# Patient Record
Sex: Female | Born: 1980 | Race: White | Hispanic: No | Marital: Single | Smoking: Never smoker
Health system: Southern US, Community
[De-identification: ages and names within clinical notes are randomized; demographics above are authoritative.]

## PROBLEM LIST (undated history)

## (undated) DIAGNOSIS — T7840XA Allergy, unspecified, initial encounter: Secondary | ICD-10-CM

## (undated) HISTORY — DX: Allergy, unspecified, initial encounter: T78.40XA

---

## 2008-11-16 HISTORY — PX: EYE SURGERY: SHX253

## 2013-07-01 ENCOUNTER — Emergency Department: Payer: Self-pay | Admitting: Emergency Medicine

## 2014-07-29 ENCOUNTER — Ambulatory Visit
Admission: EM | Admit: 2014-07-29 | Discharge: 2014-07-29 | Disposition: A | Discharge status: Home / Self Care | Payer: BLUE CROSS/BLUE SHIELD | Attending: Emergency Medicine | Admitting: Emergency Medicine

## 2014-07-29 ENCOUNTER — Ambulatory Visit: Payer: BLUE CROSS/BLUE SHIELD

## 2014-07-29 ENCOUNTER — Ambulatory Visit (HOSPITAL_COMMUNITY): Payer: BLUE CROSS/BLUE SHIELD

## 2014-07-29 ENCOUNTER — Other Ambulatory Visit: Payer: Self-pay

## 2014-07-29 DIAGNOSIS — J189 Pneumonia, unspecified organism: Secondary | ICD-10-CM | POA: Diagnosis not present

## 2014-07-29 DIAGNOSIS — R079 Chest pain, unspecified: Secondary | ICD-10-CM

## 2014-07-29 DIAGNOSIS — R05 Cough: Secondary | ICD-10-CM | POA: Diagnosis present

## 2014-07-29 LAB — CBC WITH DIFFERENTIAL/PLATELET
BASOS PCT: 1 %
Basophils Absolute: 0.1 10*3/uL (ref 0–0.1)
EOS PCT: 0 %
Eosinophils Absolute: 0 10*3/uL (ref 0–0.7)
HEMATOCRIT: 38 % (ref 35.0–47.0)
HEMOGLOBIN: 12.7 g/dL (ref 12.0–16.0)
Lymphocytes Relative: 6 %
Lymphs Abs: 0.8 10*3/uL — ABNORMAL LOW (ref 1.0–3.6)
MCH: 29.5 pg (ref 26.0–34.0)
MCHC: 33.4 g/dL (ref 32.0–36.0)
MCV: 88.4 fL (ref 80.0–100.0)
Monocytes Absolute: 1.1 10*3/uL — ABNORMAL HIGH (ref 0.2–0.9)
Monocytes Relative: 8 %
Neutro Abs: 12.2 10*3/uL — ABNORMAL HIGH (ref 1.4–6.5)
Neutrophils Relative %: 85 %
Platelets: 170 10*3/uL (ref 150–440)
RBC: 4.3 MIL/uL (ref 3.80–5.20)
RDW: 12.5 % (ref 11.5–14.5)
WBC: 14.2 10*3/uL — ABNORMAL HIGH (ref 3.6–11.0)

## 2014-07-29 LAB — BASIC METABOLIC PANEL
Anion gap: 7 (ref 5–15)
BUN: 8 mg/dL (ref 6–20)
CALCIUM: 9 mg/dL (ref 8.9–10.3)
CO2: 25 mmol/L (ref 22–32)
Chloride: 104 mmol/L (ref 101–111)
Creatinine, Ser: 0.85 mg/dL (ref 0.44–1.00)
GFR calc Af Amer: >60 mL/min (ref >60)
GFR calc non Af Amer: >60 mL/min (ref >60)
GLUCOSE: 119 mg/dL — AB (ref 65–99)
Potassium: 4.2 mmol/L (ref 3.5–5.1)
SODIUM: 136 mmol/L (ref 135–145)

## 2014-07-29 LAB — FIBRIN DERIVATIVES D-DIMER (ARMC ONLY): Fibrin derivatives D-dimer (ARMC): 550 — ABNORMAL HIGH (ref 0–499)

## 2014-07-29 MED ORDER — ALBUTEROL SULFATE HFA 108 (90 BASE) MCG/ACT IN AERS: 2.0000 | INHALATION_SPRAY | RESPIRATORY_TRACT | Status: DC | PRN

## 2014-07-29 MED ORDER — AZITHROMYCIN 250 MG PO TABS: 250.0000 mg | ORAL_TABLET | Freq: Every day | ORAL | Status: DC

## 2014-07-29 MED ORDER — HYDROCOD POLST-CPM POLST ER 10-8 MG/5ML PO SUER: 5.0000 mL | Freq: Two times a day (BID) | ORAL | Status: DC | PRN

## 2014-07-29 MED ORDER — ACETAMINOPHEN 500 MG PO TABS
500.0000 mg | ORAL_TABLET | Freq: Once | ORAL | Status: AC
  Administered 2014-07-29: 500 mg via ORAL

## 2014-07-29 MED ORDER — NAPROXEN 500 MG PO TABS: 500.0000 mg | ORAL_TABLET | Freq: Two times a day (BID) | ORAL | Status: DC

## 2014-07-29 MED ORDER — KETOROLAC TROMETHAMINE 60 MG/2ML IM SOLN
60.0000 mg | Freq: Once | INTRAMUSCULAR | Status: AC
  Administered 2014-07-29: 60 mg via INTRAMUSCULAR

## 2014-07-29 NOTE — ED Notes (Signed)
Patient wuith left sided rib/chest pain, left arm pain started yesterday. Hasnt been feeling well the past few days.

## 2014-07-29 NOTE — Discharge Instructions (Signed)
2 puffs from your albuterol inhaler every 4-6 hours as needed for coughing and wheezing. naprosyn will help with fever and also with the chest wall pain. Go to the ER for the signs and symptoms we discussed

## 2014-07-29 NOTE — ED Provider Notes (Signed)
HPI  SUBJECTIVE:  Mercedes Ballard is a 34 y.o. female who presents with constant left-sided lower rib/chest pain that radiates up to her neck into her shoulder starting at 0700 today. She describes the pain as sharp, worse with inspiration.  Symptoms are worse with deep inspiration, moving, torso rotation, lying down, better with shallow breathing. She has not been feeling well for several days of URI-like symptoms. Reports cough, malaise, nasal congestion, sore scratchy throat, postnasal drip, feeling feverish, but did not take her temperature last night. She states that she's been doing "lots of coughing" and reports wheezing. She denies any trauma to the chest. There is no exertional component to her pain. Patient denies nausea, vomiting, diaphoresis, abdominal pain, presyncope, syncope. No exogenous estrogen, surgery in the past 4 weeks, prolonged immobilization, leg pain, calf swelling, hemoptysis, history of DVT, PE, cancer. Past medical history negative for hypercholesterolemia, hypertension, diabetes, asthma, emphysema, COPD, tobacco use. Family history negative for MI, coronary artery disease.   History reviewed. No pertinent past medical history.  History reviewed. No pertinent past surgical history.  History reviewed. No pertinent family history.  History  Substance Use Topics  . Smoking status: Never Smoker   . Smokeless tobacco: Never Used  . Alcohol Use: Yes     Comment: occasional    No current facility-administered medications for this encounter.  Current outpatient prescriptions:  .  albuterol (PROVENTIL HFA;VENTOLIN HFA) 108 (90 BASE) MCG/ACT inhaler, Inhale 2 puffs into the lungs every 4 (four) hours as needed for wheezing or shortness of breath. Dispense with aerochamber, Disp: 1 Inhaler, Rfl: 0 .  azithromycin (ZITHROMAX) 250 MG tablet, Take 1 tablet (250 mg total) by mouth daily. 2 tabs po on day 1, 1 tab po on days 2-5, Disp: 6 tablet, Rfl: 0 .   chlorpheniramine-HYDROcodone (TUSSIONEX PENNKINETIC ER) 10-8 MG/5ML SUER, Take 5 mLs by mouth every 12 (twelve) hours as needed for cough., Disp: 120 mL, Rfl: 0 .  naproxen (NAPROSYN) 500 MG tablet, Take 1 tablet (500 mg total) by mouth 2 (two) times daily., Disp: 20 tablet, Rfl: 0  No Known Allergies   ROS  As noted in HPI.   Physical Exam  BP 95/65 mmHg  Pulse 86  Temp(Src) 101 F (38.3 C) (Tympanic)  Resp 20  Ht  (1.727 m)  Wt 145 lb (65.772 kg)  BMI 22.05 kg/m2  SpO2 99%  LMP 07/23/2014  Constitutional: Well developed, well nourished, appears uncomfortable Eyes: PERRL, EOMI, conjunctiva normal bilaterally HENT: Normocephalic, atraumatic,mucus membranes moist Respiratory: Poor inspiratory effort Clear to auscultation bilaterally, no rales, no wheezing, no rhonchi. No chest wall tenderness Cardiovascular: Normal rate and rhythm, no murmurs, no gallops, no rubs. RP, DP 2+ equal bilaterally GI: Soft, nondistended, normal bowel sounds, nontender, no rebound, no guarding Back: no CVAT skin: No rash, skin intact Musculoskeletal: No edema, no tenderness, no deformities, calves Symmetric, Nontender Neurologic: Alert & oriented x 3, CN II-XII grossly intact, no motor deficits, sensation grossly intact Psychiatric: Speech and behavior appropriate   ED Course   Medications  ketorolac (TORADOL) injection 60 mg (60 mg Intramuscular Given 07/29/14 1409)  acetaminophen (TYLENOL) tablet 500 mg (500 mg Oral Given 07/29/14 1415)    Orders Placed This Encounter  Procedures  . DG Chest 2 View    Standing Status: Standing     Number of Occurrences: 1     Standing Expiration Date:     Order Specific Question:  Symptom/Reason for Exam    Answer:  Left sided chest pain [[id number]]  . CBC with Differential    Standing Status: Standing     Number of Occurrences: 1     Standing Expiration Date:   . Fibrin derivatives D-Dimer    Standing Status: Standing     Number of Occurrences: 1      Standing Expiration Date:   . Basic metabolic panel    Standing Status: Standing     Number of Occurrences: 1     Standing Expiration Date:   . ED EKG    Standing Status: Standing     Number of Occurrences: 1     Standing Expiration Date:     Order Specific Question:  Reason for Exam    Answer:  Chest Pain   Results for orders placed or performed during the hospital encounter of 07/29/14 (from the past 24 hour(s))  CBC with Differential     Status: Abnormal   Collection Time: 07/29/14  2:09 PM  Result Value Ref Range   WBC 14.2 (H) 3.6 - 11.0 K/uL   RBC 4.30 3.80 - 5.20 MIL/uL   Hemoglobin 12.7 12.0 - 16.0 g/dL   HCT 45.4 09.8 - 11.9 %   MCV 88.4 80.0 - 100.0 fL   MCH 29.5 26.0 - 34.0 pg   MCHC 33.4 32.0 - 36.0 g/dL   RDW 14.7 82.9 - 56.2 %   Platelets 170 150 - 440 K/uL   Neutrophils Relative % 85 %   Neutro Abs 12.2 (H) 1.4 - 6.5 K/uL   Lymphocytes Relative 6 %   Lymphs Abs 0.8 (L) 1.0 - 3.6 K/uL   Monocytes Relative 8 %   Monocytes Absolute 1.1 (H) 0.2 - 0.9 K/uL   Eosinophils Relative 0 %   Eosinophils Absolute 0.0 0 - 0.7 K/uL   Basophils Relative 1 %   Basophils Absolute 0.1 0 - 0.1 K/uL  Fibrin derivatives D-Dimer     Status: Abnormal   Collection Time: 07/29/14  2:09 PM  Result Value Ref Range   Fibrin derivatives D-dimer ([facility]) 550 (H) 0 - 499  Basic metabolic panel     Status: Abnormal   Collection Time: 07/29/14  2:09 PM  Result Value Ref Range   Sodium 136 135 - 145 mmol/L   Potassium 4.2 3.5 - 5.1 mmol/L   Chloride 104 101 - 111 mmol/L   CO2 25 22 - 32 mmol/L   Glucose, Bld 119 (H) 65 - 99 mg/dL   BUN 8 6 - 20 mg/dL   Creatinine, Ser 1.30 0.44 - 1.00 mg/dL   Calcium 9.0 8.9 - 86.5 mg/dL   GFR calc non Af Amer >60 >60 mL/min   GFR calc Af Amer >60 >60 mL/min   Anion gap 7 5 - 15   Dg Chest 2 View  07/29/2014   CLINICAL DATA:  Productive cough for 4 days. Worsening symptoms today. Initial encounter.  EXAM: CHEST  2 VIEW  COMPARISON:  None.   FINDINGS: The heart size and mediastinal contours are normal. There is patchy retrocardiac left lower lobe airspace disease which is best seen on the frontal examination. The right lung is clear. There is no pleural effusion. The bones appear unchanged.  IMPRESSION: Left lower lobe pneumonia. Followup PA and lateral chest X-ray is recommended in 3-4 weeks following trial of antibiotic therapy to ensure resolution and exclude underlying malignancy.   Electronically Signed   By: Carey Bullocks M.D.   On: 07/29/2014 15:16    ED Clinical  Impression  Community acquired pneumonia  Left sided chest pain - Plan: DG Chest 2 View, DG Chest 2 View   ED Assessment/Plan  EKG: Normal sinus rhythm, rate 87 normal axis, normal intervals. No hypertrophy. No ST-T wave changes. No previous EKG for comparison.   Patient had a fever of 101 while here, gave her some Tylenol. Also gave her some Toradol for her presumed pleurisy. Obtaining x-ray CBC, BMP, d-dimer.  Labs, imaging reviewed. Left lower lobe pneumonia. See radiology report for details. Mild leukocytosis. Elevated dimer noted, however, patient meets PERC rule, and Wells criteria is 0 out of 9. She is not tachycardic, she is satting well on room air, and we have a reasonable expiration of her chest pain. She is low risk for PE. We'll send him home with cough syrup, azithromycin, albuterol.   Reviewed labs, imaging independently. As above.   Discussed labs, imaging, MDM, plan and followup with patient. Discussed sn/sx that should prompt return to the UC or ED. Patient agrees with plan.   *This clinic note was created using Dragon dictation software. Therefore, there may be occasional mistakes despite careful proofreading.  ?   Domenick Gong, MD 07/29/14 1534

## 2014-08-01 ENCOUNTER — Encounter: Payer: Self-pay | Admitting: Family Medicine

## 2014-08-01 ENCOUNTER — Ambulatory Visit (INDEPENDENT_AMBULATORY_CARE_PROVIDER_SITE_OTHER): Payer: BLUE CROSS/BLUE SHIELD | Admitting: Family Medicine

## 2014-08-01 VITALS — BP 98/60 | HR 70 | Ht 68.0 in | Wt 147.4 lb

## 2014-08-01 DIAGNOSIS — J189 Pneumonia, unspecified organism: Secondary | ICD-10-CM | POA: Diagnosis not present

## 2014-08-01 DIAGNOSIS — J181 Lobar pneumonia, unspecified organism: Principal | ICD-10-CM

## 2014-08-01 DIAGNOSIS — J302 Other seasonal allergic rhinitis: Secondary | ICD-10-CM

## 2014-08-01 NOTE — Progress Notes (Signed)
Date:  08/01/2014   Name:  Mercedes Ballard   DOB:  [birth year]   MRN:  [id number]  PCP:  Schuyler Amor, MD    Chief Complaint: Establish Care   History of Present Illness:  This is a 34 y.o. female seen [facility] with LLL pneumonia on CXR with associated fever and leukocytosis, also L sided chest pain. Given Toradol and placed on Zithromax, Naprosyn, Proventil inhaler prn, and Tussionex prn. Feeling significantly better today, fever and chest resolved, no longer needing Proventil. Still fatigued but this improving also. 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 Still with productive cough, occ blood-tinged. Hx walking pneumonia 7 yrs ago, no other recent infections.  Review of Systems:  Review of Systems  There are no active problems to display for this patient.   Prior to Admission medications   Medication Sig Start Date End Date Taking? Authorizing Provider  albuterol (PROVENTIL HFA;VENTOLIN HFA) 108 (90 BASE) MCG/ACT inhaler Inhale 2 puffs into the lungs every 4 (four) hours as needed for wheezing or shortness of breath. Dispense with aerochamber 07/29/14  Yes Domenick Gong, MD  azithromycin (ZITHROMAX) 250 MG tablet Take 1 tablet (250 mg total) by mouth daily. 2 tabs po on day 1, 1 tab po on days 2-5 07/29/14  Yes Domenick Gong, MD  loratadine (SM LORATADINE) 5 MG/5ML syrup Take by mouth. 05/28/10  Yes Historical Provider, MD    Allergies  Allergen Reactions  . Latex Rash    Past Surgical History  Procedure Laterality Date  . Eye surgery Bilateral 11/2008    History  Substance Use Topics  . Smoking status: Never Smoker   . Smokeless tobacco: Never Used  . Alcohol Use: 0.0 oz/week    0 Standard drinks or equivalent per week     Comment: occasional    Family History  Problem Relation Age of Onset  . Varicose Veins Mother   . Alcohol abuse Father   . Asthma Father   . Depression Father   . Drug abuse Father   .  Hypertension Father   . Heart disease Maternal Uncle   . Stroke Maternal Uncle     Medication list has been reviewed and updated.  Physical Examination: BP 98/60 mmHg  Pulse 70  Ht 5\' 8"  (1.727 m)  Wt 147 lb 6.4 oz (66.86 kg)  BMI 22.42 kg/m2  LMP 07/23/2014  Physical Exam  Constitutional: She appears well-developed and well-nourished. No distress.  Cardiovascular: Normal rate, regular rhythm and normal heart sounds.  Exam reveals no gallop.   No murmur heard. Pulmonary/Chest: Effort normal. No respiratory distress. She has no wheezes.  Rales L lung base  Skin: She is not diaphoretic.  Psychiatric: She has a normal mood and affect. Her behavior is normal.    Assessment and Plan:  1. LLL pneumonia Resolving with Zpak, complete course, d/c Tussionex and Naprosyn, may use Mucinex, call if sxs worsen or persist > 1 wk, cough may be last sxs to resolve   Return if symptoms worsen or fail to improve.  Dionne Ano. Kingsley Spittle MD [facility]  08/01/2014

## 2015-05-01 ENCOUNTER — Ambulatory Visit (INDEPENDENT_AMBULATORY_CARE_PROVIDER_SITE_OTHER): Payer: BLUE CROSS/BLUE SHIELD | Admitting: Family Medicine

## 2015-05-01 ENCOUNTER — Encounter: Payer: Self-pay | Admitting: Family Medicine

## 2015-05-01 VITALS — BP 106/58 | HR 68 | Ht 68.0 in | Wt 151.0 lb

## 2015-05-01 DIAGNOSIS — J40 Bronchitis, not specified as acute or chronic: Secondary | ICD-10-CM

## 2015-05-01 DIAGNOSIS — J0100 Acute maxillary sinusitis, unspecified: Secondary | ICD-10-CM

## 2015-05-01 MED ORDER — MONTELUKAST SODIUM 10 MG PO TABS: 10.0000 mg | ORAL_TABLET | Freq: Every day | ORAL | Status: DC

## 2015-05-01 MED ORDER — AMOXICILLIN-POT CLAVULANATE 875-125 MG PO TABS: 1.0000 | ORAL_TABLET | Freq: Two times a day (BID) | ORAL | Status: DC

## 2015-05-01 MED ORDER — GUAIFENESIN-CODEINE 100-10 MG/5ML PO SYRP: 5.0000 mL | ORAL_SOLUTION | Freq: Three times a day (TID) | ORAL | Status: DC | PRN

## 2015-05-01 NOTE — Progress Notes (Signed)
Name: Mercedes Ballard   MRN: [id number]    DOB: Sep 08, 1980   Date:05/02/2015       Progress Note  Subjective  Chief Complaint  Chief Complaint  Patient presents with  . Sinusitis    Congestion, cough, green mucus drainage for 1 week. Patient has been using Claritin and Mucinex. Patient has history of pneumonia    Sinusitis This is a new problem. The current episode started 1 to 4 weeks ago. The problem has been waxing and waning since onset. There has been no fever. Associated symptoms include congestion, coughing, ear pain, headaches, sinus pressure and a sore throat. Pertinent negatives include no chills, neck pain or shortness of breath. Past treatments include acetaminophen and oral decongestants. The treatment provided mild relief.    No problem-specific assessment & plan notes found for this encounter.   Past Medical History  Diagnosis Date  . Allergy     Past Surgical History  Procedure Laterality Date  . Eye surgery Bilateral 11/2008    Family History  Problem Relation Age of Onset  . Varicose Veins Mother   . Alcohol abuse Father   . Asthma Father   . Depression Father   . Drug abuse Father   . Hypertension Father   . Heart disease Maternal Uncle   . Stroke Maternal Uncle     Social History   Social History  . Marital Status: Unknown    Spouse Name: N/A  . Number of Children: N/A  . Years of Education: N/A   Occupational History  . Not on file.   Social History Main Topics  . Smoking status: Never Smoker   . Smokeless tobacco: Never Used  . Alcohol Use: 0.0 oz/week    0 Standard drinks or equivalent per week     Comment: occasional  . Drug Use: No  . Sexual Activity:    Partners: Male    Pharmacist, hospital Protection: IUD   Other Topics Concern  . Not on file   Social History Narrative    Allergies  Allergen Reactions  . Latex Rash     Review of Systems  Constitutional: Negative for fever, chills, weight loss and malaise/fatigue.  HENT:  Positive for congestion, ear pain, sinus pressure and sore throat. Negative for ear discharge.   Eyes: Negative for blurred vision.  Respiratory: Positive for cough. Negative for sputum production, shortness of breath and wheezing.   Cardiovascular: Negative for chest pain, palpitations and leg swelling.  Gastrointestinal: Negative for heartburn, nausea, abdominal pain, diarrhea, constipation, blood in stool and melena.  Genitourinary: Negative for dysuria, urgency, frequency and hematuria.  Musculoskeletal: Negative for myalgias, back pain, joint pain and neck pain.  Skin: Negative for rash.  Neurological: Positive for headaches. Negative for dizziness, tingling, sensory change and focal weakness.  Endo/Heme/Allergies: Negative for environmental allergies and polydipsia. Does not bruise/bleed easily.  Psychiatric/Behavioral: Negative for depression and suicidal ideas. The patient is not nervous/anxious and does not have insomnia.      Objective  Filed Vitals:   05/01/15 1358  BP: 106/58  Pulse: 68  Height:  (1.727 m)  Weight: 151 lb (68.493 kg)    Physical Exam  Constitutional: She is well-developed, well-nourished, and in no distress. No distress.  HENT:  Head: Normocephalic and atraumatic.  Right Ear: External ear normal.  Left Ear: External ear normal.  Nose: Nose normal.  Mouth/Throat: Oropharynx is clear and moist.  Eyes: Conjunctivae and EOM are normal. Pupils are equal, round, and reactive  to light. Right eye exhibits no discharge. Left eye exhibits no discharge.  Neck: Normal range of motion. Neck supple. No JVD present. No thyromegaly present.  Cardiovascular: Normal rate, regular rhythm, normal heart sounds and intact distal pulses.  Exam reveals no gallop and no friction rub.   No murmur heard. Pulmonary/Chest: Effort normal and breath sounds normal.  Abdominal: Soft. Bowel sounds are normal. She exhibits no mass. There is no tenderness. There is no guarding.   Musculoskeletal: Normal range of motion. She exhibits no edema.  Lymphadenopathy:    She has no cervical adenopathy.  Neurological: She is alert. She has normal reflexes.  Skin: Skin is warm and dry. She is not diaphoretic.  Psychiatric: Mood and affect normal.  Nursing note and vitals reviewed.     Assessment & Plan  Problem List Items Addressed This Visit    None    Visit Diagnoses    Acute maxillary sinusitis, recurrence not specified    -  Primary    Relevant Medications    loratadine (CLARITIN) 10 MG tablet    amoxicillin-clavulanate (AUGMENTIN) 875-125 MG tablet    guaiFENesin-codeine (ROBITUSSIN AC) 100-10 MG/5ML syrup    montelukast (SINGULAIR) 10 MG tablet    Bronchitis        Relevant Medications    amoxicillin-clavulanate (AUGMENTIN) 875-125 MG tablet    guaiFENesin-codeine (ROBITUSSIN AC) 100-10 MG/5ML syrup         Dr. Hayden Rasmusseneanna Keeven Matty [facility]  05/02/2015

## 2015-05-08 ENCOUNTER — Ambulatory Visit (INDEPENDENT_AMBULATORY_CARE_PROVIDER_SITE_OTHER): Payer: BLUE CROSS/BLUE SHIELD | Admitting: Family Medicine

## 2015-05-08 ENCOUNTER — Encounter: Payer: Self-pay | Admitting: Family Medicine

## 2015-05-08 VITALS — BP 100/62 | HR 82 | Temp 99.9°F | Resp 16

## 2015-05-08 DIAGNOSIS — R509 Fever, unspecified: Secondary | ICD-10-CM

## 2015-05-08 DIAGNOSIS — A084 Viral intestinal infection, unspecified: Secondary | ICD-10-CM

## 2015-05-08 NOTE — Patient Instructions (Signed)

## 2015-05-10 NOTE — Progress Notes (Signed)
Date:  05/08/2015   Name:  Mercedes Ballard   DOB:  10/29/1980   MRN:  [id number]  PCP:  Schuyler AmorWilliam Caylor Cerino, MD    Chief Complaint: Fever and Diarrhea   History of Present Illness:  This is a 35 y.o. female with myalgias, nausea, headache, diarrhea, and fever since yesterday. On Augmentin since 3/15 for sinusitis which has improved. No known exposure.  Review of Systems:  Review of Systems  HENT: Negative for rhinorrhea and sore throat.   Respiratory: Negative for cough.   Gastrointestinal: Negative for blood in stool.  Genitourinary: Negative for difficulty urinating.  Neurological: Negative for syncope and light-headedness.    Patient Active Problem List   Diagnosis Date Noted  . Allergic rhinitis, seasonal 08/01/2014    Prior to Admission medications   Medication Sig Start Date End Date Taking? Authorizing Provider  amoxicillin-clavulanate (AUGMENTIN) 875-125 MG tablet Take 1 tablet by mouth 2 (two) times daily. 05/01/15  Yes Duanne Limerickeanna C Jones, MD  guaiFENesin-codeine (ROBITUSSIN AC) 100-10 MG/5ML syrup Take 5 mLs by mouth 3 (three) times daily as needed for cough. 05/01/15  Yes Duanne Limerickeanna C Jones, MD  Levonorgestrel (MIRENA, 52 MG, IU) by Intrauterine route.   Yes Historical Provider, MD  loratadine (CLARITIN) 10 MG tablet Take 10 mg by mouth daily.   Yes Historical Provider, MD  montelukast (SINGULAIR) 10 MG tablet Take 1 tablet (10 mg total) by mouth at bedtime. 05/01/15  Yes Duanne Limerickeanna C Jones, MD  naproxen (NAPROSYN) 250 MG tablet Take 250 mg by mouth 2 (two) times daily with a meal.   Yes Historical Provider, MD    Allergies  Allergen Reactions  . Latex Rash    Past Surgical History  Procedure Laterality Date  . Eye surgery Bilateral 11/2008    Social History  Substance Use Topics  . Smoking status: Never Smoker   . Smokeless tobacco: Never Used  . Alcohol Use: 0.0 oz/week    0 Standard drinks or equivalent per week     Comment: occasional    Family History  Problem Relation  Age of Onset  . Varicose Veins Mother   . Alcohol abuse Father   . Asthma Father   . Depression Father   . Drug abuse Father   . Hypertension Father   . Heart disease Maternal Uncle   . Stroke Maternal Uncle     Medication list has been reviewed and updated.  Physical Examination: BP 100/62 mmHg  Pulse 82  Temp(Src) 99.9 F (37.7 C)  Resp 16  SpO2 97%  Physical Exam  Constitutional: She appears well-developed and well-nourished.  Pulmonary/Chest: Effort normal and breath sounds normal.  Abdominal: Soft. She exhibits no distension and no mass. There is no rebound and no guarding.  Mild diffuse tenderness  Musculoskeletal: She exhibits no edema.  Neurological: She is alert.  Skin: Skin is warm and dry.  Psychiatric: She has a normal mood and affect. Her behavior is normal.  Nursing note and vitals reviewed.   Assessment and Plan:  1. Viral gastroenteritis Push PO fluids, Tylenol or Advil for myalgias  2. Fever, unspecified fever cause Rapid flu negative - POCT Influenza A/B  Return if symptoms worsen or fail to improve.  Dionne AnoWilliam M. Kingsley SpittlePlonk, Jr. MD [facility]  05/10/2015

## 2015-06-27 ENCOUNTER — Other Ambulatory Visit: Payer: Self-pay

## 2016-04-30 ENCOUNTER — Ambulatory Visit (INDEPENDENT_AMBULATORY_CARE_PROVIDER_SITE_OTHER): Payer: BLUE CROSS/BLUE SHIELD | Admitting: Family Medicine

## 2016-04-30 ENCOUNTER — Encounter: Payer: Self-pay | Admitting: Family Medicine

## 2016-04-30 VITALS — BP 128/80 | HR 68 | Ht 68.0 in | Wt 148.0 lb

## 2016-04-30 DIAGNOSIS — N644 Mastodynia: Secondary | ICD-10-CM | POA: Diagnosis not present

## 2016-04-30 DIAGNOSIS — N3090 Cystitis, unspecified without hematuria: Secondary | ICD-10-CM

## 2016-04-30 DIAGNOSIS — Z0000 Encounter for general adult medical examination without abnormal findings: Secondary | ICD-10-CM | POA: Diagnosis not present

## 2016-04-30 LAB — POCT URINALYSIS DIPSTICK
Bilirubin, UA: NEGATIVE
Blood, UA: NEGATIVE
GLUCOSE UA: NEGATIVE
Ketones, UA: NEGATIVE
Nitrite, UA: NEGATIVE
Protein, UA: NEGATIVE
Spec Grav, UA: 1.02
UROBILINOGEN UA: 0.2
pH, UA: 6

## 2016-04-30 MED ORDER — SULFAMETHOXAZOLE-TRIMETHOPRIM 800-160 MG PO TABS: 1.0000 | ORAL_TABLET | Freq: Two times a day (BID) | ORAL | 0 refills | Status: DC

## 2016-04-30 NOTE — Patient Instructions (Signed)

## 2016-04-30 NOTE — Progress Notes (Signed)
Name: Mercedes Ballard   MRN: [id number]    DOB: [birth year]   Date:04/30/2016       Progress Note  Subjective  Chief Complaint  Chief Complaint  Patient presents with  . Annual Exam    no pap  . Breast Problem    bilateral breast tenderness    Patient presents for annual physical exam. Only specific concern is breast tenderness bilateral..    No problem-specific Assessment & Plan notes found for this encounter.   Past Medical History:  Diagnosis Date  . Allergy     Past Surgical History:  Procedure Laterality Date  . EYE SURGERY Bilateral 11/2008    Family History  Problem Relation Age of Onset  . Varicose Veins Mother   . Alcohol abuse Father   . Asthma Father   . Depression Father   . Drug abuse Father   . Hypertension Father   . Heart disease Maternal Uncle   . Stroke Maternal Uncle     Social History   Social History  . Marital status: Unknown    Spouse name: N/A  . Number of children: N/A  . Years of education: N/A   Occupational History  . Not on file.   Social History Main Topics  . Smoking status: Never Smoker  . Smokeless tobacco: Never Used  . Alcohol use 0.0 oz/week     Comment: occasional  . Drug use: No  . Sexual activity: Yes    Partners: Male    Birth control/ protection: IUD   Other Topics Concern  . Not on file   Social History Narrative  . No narrative on file    Allergies  Allergen Reactions  . Latex Rash    Outpatient Medications Prior to Visit  Medication Sig Dispense Refill  . Levonorgestrel (MIRENA, 52 MG, IU) by Intrauterine route.    . loratadine (CLARITIN) 10 MG tablet Take 10 mg by mouth daily.    Marland Kitchen amoxicillin-clavulanate (AUGMENTIN) 875-125 MG tablet Take 1 tablet by mouth 2 (two) times daily. 20 tablet 0  . guaiFENesin-codeine (ROBITUSSIN AC) 100-10 MG/5ML syrup Take 5 mLs by mouth 3 (three) times daily as needed for cough. 150 mL 0  . montelukast (SINGULAIR) 10 MG tablet Take 1 tablet (10 mg total) by mouth at  bedtime. 30 tablet 3  . naproxen (NAPROSYN) 250 MG tablet Take 250 mg by mouth 2 (two) times daily with a meal.     No facility-administered medications prior to visit.     Review of Systems  Constitutional: Negative for chills, diaphoresis, fever, malaise/fatigue and weight loss.  HENT: Negative for congestion, ear discharge, ear pain, hearing loss, nosebleeds, sinus pain, sore throat and tinnitus.   Eyes: Negative for blurred vision, double vision, photophobia, pain, discharge and redness.  Respiratory: Negative for cough, hemoptysis, sputum production, shortness of breath, wheezing and stridor.   Cardiovascular: Negative for chest pain, palpitations, orthopnea, claudication, leg swelling and PND.       Evening lower extremeties sweeling  Gastrointestinal: Positive for heartburn. Negative for abdominal pain, blood in stool, constipation, diarrhea, melena, nausea and vomiting.       Certain foods  Genitourinary: Negative for dysuria, flank pain, frequency, hematuria and urgency.  Musculoskeletal: Negative for back pain, falls, joint pain, myalgias and neck pain.  Skin: Negative for itching and rash.  Neurological: Negative for dizziness, tingling, tremors, sensory change, speech change, focal weakness, seizures, loss of consciousness, weakness and headaches.  Endo/Heme/Allergies: Negative for environmental allergies and polydipsia.  Does not bruise/bleed easily.       No galactorrhea  Psychiatric/Behavioral: Negative for depression, hallucinations, memory loss, substance abuse and suicidal ideas. The patient is not nervous/anxious and does not have insomnia.      Objective  Vitals:   04/30/16 0946  BP: 128/80  Pulse: 68  Weight: 148 lb (67.1 kg)  Height: 5\' 8"  (1.727 m)    Physical Exam  Constitutional: She is oriented to person, place, and time and well-developed, well-nourished, and in no distress. No distress.  HENT:  Head: Normocephalic and atraumatic.  Right Ear: Tympanic  membrane, external ear and ear canal normal.  Left Ear: Tympanic membrane, external ear and ear canal normal.  Nose: Nose normal.  Mouth/Throat: Uvula is midline, oropharynx is clear and moist and mucous membranes are normal. No oropharyngeal exudate, posterior oropharyngeal edema, posterior oropharyngeal erythema or tonsillar abscesses.  Eyes: Conjunctivae and EOM are normal. Pupils are equal, round, and reactive to light. Right eye exhibits no discharge. Left eye exhibits no discharge. No scleral icterus.  Fundoscopic exam:      The right eye shows no arteriolar narrowing, no AV nicking and no papilledema.       The left eye shows no arteriolar narrowing, no AV nicking and no papilledema.  Neck: Trachea normal and normal range of motion. Neck supple. Normal carotid pulses, no hepatojugular reflux and no JVD present. Carotid bruit is not present. No thyromegaly present.  Cardiovascular: Normal rate, regular rhythm, S1 normal, S2 normal, normal heart sounds, intact distal pulses and normal pulses.  PMI is not displaced.  Exam reveals no gallop, no S3, no S4 and no friction rub.   No murmur heard. Pulmonary/Chest: Effort normal and breath sounds normal. No accessory muscle usage. No respiratory distress. Right breast exhibits no inverted nipple, no mass, no nipple discharge, no skin change and no tenderness. Left breast exhibits no inverted nipple, no mass, no nipple discharge, no skin change and no tenderness. Breasts are symmetrical.  Abdominal: Soft. Normal appearance, normal aorta and bowel sounds are normal. She exhibits no mass. There is no hepatosplenomegaly. There is no tenderness. There is no guarding and no CVA tenderness.  Musculoskeletal: Normal range of motion. She exhibits no edema.  Lymphadenopathy:       Head (right side): No submental, no submandibular, no tonsillar, no preauricular, no posterior auricular and no occipital adenopathy present.       Head (left side): No submental, no  submandibular, no tonsillar, no preauricular, no posterior auricular and no occipital adenopathy present.    She has no cervical adenopathy.    She has no axillary adenopathy.  Neurological: She is alert and oriented to person, place, and time. She has normal sensation, normal strength, normal reflexes and intact cranial nerves.  Skin: Skin is warm and dry. She is not diaphoretic.  Nevus left 5th toe/sole  Psychiatric: Mood and affect normal.      Assessment & Plan  Problem List Items Addressed This Visit    None    Visit Diagnoses    Annual physical exam    -  Primary   Relevant Orders   Lipid Profile   Renal function panel   POCT urinalysis dipstick (Completed)   Cystitis       Relevant Medications   sulfamethoxazole-trimethoprim (BACTRIM DS,SEPTRA DS) 800-160 MG tablet      Meds ordered this encounter  Medications  . sulfamethoxazole-trimethoprim (BACTRIM DS,SEPTRA DS) 800-160 MG tablet    Sig: Take 1 tablet by  mouth 2 (two) times daily.    Dispense:  6 tablet    Refill:  0      Dr. Hayden Rasmusseneanna Jerone Cudmore [facility]  04/30/16

## 2016-05-01 LAB — RENAL FUNCTION PANEL
ALBUMIN: 4.7 g/dL (ref 3.5–5.5)
BUN/Creatinine Ratio: 14 (ref 9–23)
BUN: 13 mg/dL (ref 6–20)
CALCIUM: 9.2 mg/dL (ref 8.7–10.2)
CO2: 25 mmol/L (ref 18–29)
CREATININE: 0.93 mg/dL (ref 0.57–1.00)
Chloride: 103 mmol/L (ref 96–106)
GFR calc Af Amer: 91 mL/min/{1.73_m2} (ref >59)
GFR, EST NON AFRICAN AMERICAN: 79 mL/min/{1.73_m2} (ref >59)
Glucose: 86 mg/dL (ref 65–99)
PHOSPHORUS: 3.2 mg/dL (ref 2.5–4.5)
Potassium: 4.1 mmol/L (ref 3.5–5.2)
Sodium: 144 mmol/L (ref 134–144)

## 2016-05-01 LAB — LIPID PANEL
CHOLESTEROL TOTAL: 180 mg/dL (ref 100–199)
Chol/HDL Ratio: 2.5 ratio units (ref 0.0–4.4)
HDL: 71 mg/dL (ref >39)
LDL Calculated: 92 mg/dL (ref 0–99)
Triglycerides: 87 mg/dL (ref 0–149)
VLDL Cholesterol Cal: 17 mg/dL (ref 5–40)

## 2016-05-04 NOTE — Addendum Note (Signed)
Addended by: Everitt AmberLYNCH, TARA L on: 05/04/2016 04:30 PM   Modules accepted: Orders

## 2016-05-05 ENCOUNTER — Other Ambulatory Visit: Payer: Self-pay

## 2016-05-05 DIAGNOSIS — N644 Mastodynia: Secondary | ICD-10-CM

## 2016-05-13 ENCOUNTER — Ambulatory Visit
Admission: RE | Admit: 2016-05-13 | Discharge: 2016-05-13 | Disposition: A | Discharge status: Home / Self Care | Payer: BLUE CROSS/BLUE SHIELD | Source: Ambulatory Visit | Attending: Family Medicine | Admitting: Family Medicine

## 2016-05-13 ENCOUNTER — Other Ambulatory Visit: Payer: Self-pay | Admitting: Family Medicine

## 2016-05-13 DIAGNOSIS — N644 Mastodynia: Secondary | ICD-10-CM

## 2016-05-13 DIAGNOSIS — Z1231 Encounter for screening mammogram for malignant neoplasm of breast: Secondary | ICD-10-CM | POA: Diagnosis not present

## 2016-12-29 DIAGNOSIS — L578 Other skin changes due to chronic exposure to nonionizing radiation: Secondary | ICD-10-CM | POA: Diagnosis not present

## 2016-12-29 DIAGNOSIS — L811 Chloasma: Secondary | ICD-10-CM | POA: Diagnosis not present

## 2016-12-30 ENCOUNTER — Other Ambulatory Visit: Payer: Self-pay

## 2016-12-30 ENCOUNTER — Ambulatory Visit
Admission: EM | Admit: 2016-12-30 | Discharge: 2016-12-30 | Disposition: A | Discharge status: Home / Self Care | Payer: BLUE CROSS/BLUE SHIELD | Attending: Family Medicine | Admitting: Family Medicine

## 2016-12-30 ENCOUNTER — Ambulatory Visit (INDEPENDENT_AMBULATORY_CARE_PROVIDER_SITE_OTHER): Payer: BLUE CROSS/BLUE SHIELD

## 2016-12-30 DIAGNOSIS — S92502A Displaced unspecified fracture of left lesser toe(s), initial encounter for closed fracture: Secondary | ICD-10-CM

## 2016-12-30 DIAGNOSIS — M7989 Other specified soft tissue disorders: Secondary | ICD-10-CM | POA: Diagnosis not present

## 2016-12-30 DIAGNOSIS — W228XXA Striking against or struck by other objects, initial encounter: Secondary | ICD-10-CM | POA: Diagnosis not present

## 2016-12-30 NOTE — Discharge Instructions (Signed)
Buddy tape. Rest. Ice. Drink plenty of fluids.   Follow up with your primary care physician this week as needed. Return to Urgent care for new or worsening concerns.

## 2016-12-30 NOTE — ED Provider Notes (Signed)
[facility] ____________________________________________  Time seen: Approximately 10:34 AM  I have reviewed the triage vital signs and the nursing notes.   HISTORY  Chief Complaint Toe Pain   HPI Mercedes Ballard is a 36 y.o. female presenting for evaluation of left fifth toe pain after injury that occurred this morning.  Patient states that she was sitting at the table eating breakfast and when she went across her legs she accidentally had her fifth toe on the table causing pain.  States that initially it looked like it was laterally dislocated.  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  States the area is mildly tender, but wanted to evaluate for fracture dislocation.  No alleviating measures taken prior to arrival.  States pain is worse with direct palpation and walking.  Denies other complaints.  Reports otherwise feels well.  No LMP recorded. Patient is not currently having periods (Reason: IUD).    Past Medical History:  Diagnosis Date  . Allergy     Patient Active Problem List   Diagnosis Date Noted  . Allergic rhinitis, seasonal 08/01/2014    Past Surgical History:  Procedure Laterality Date  . EYE SURGERY Bilateral 11/2008     No current facility-administered medications for this encounter.   Current Outpatient Medications:  .  Levonorgestrel (MIRENA, 52 MG, IU), by Intrauterine route., Disp: , Rfl:  .  loratadine (CLARITIN) 10 MG tablet, Take 10 mg by mouth daily., Disp: , Rfl:   Allergies Latex  Family History  Problem Relation Age of Onset  . Varicose Veins Mother   . Alcohol abuse Father   . Asthma Father   . Depression Father   . Drug abuse Father   . Hypertension Father   . Heart disease Maternal Uncle   . Stroke Maternal Uncle   . Breast cancer Maternal Grandmother 60  . Breast cancer Paternal Grandmother        late 4130's    Social  History Social History   Tobacco Use  . Smoking status: Never Smoker  . Smokeless tobacco: Never Used  Substance Use Topics  . Alcohol use: Yes    Alcohol/week: 0.0 oz    Comment: occasional  . Drug use: No    Review of Systems Constitutional: No fever/chills Cardiovascular: Denies chest pain. Respiratory: Denies shortness of breath. Musculoskeletal: Negative for back pain. As above.  Skin: Negative for rash.   ____________________________________________   PHYSICAL EXAM:  VITAL SIGNS: ED Triage Vitals  Enc Vitals Group     BP 12/30/16 0957 112/82     Pulse Rate 12/30/16 0957 63     Resp 12/30/16 0957 16     Temp 12/30/16 0957 98.1 F (36.7 C)     Temp Source 12/30/16 0957 Oral     SpO2 12/30/16 0957 100 %     Weight 12/30/16 0958 145 lb (65.8 kg)     Height 12/30/16 0958 5\' 8"  (1.727 m)     Head Circumference --      Peak Flow --      Pain Score 12/30/16 0958 5     Pain Loc --      Pain Edu? --      Excl. in GC? --     Constitutional: Alert and oriented. Well appearing and in no acute distress. Cardiovascular: Normal rate, regular rhythm. Grossly normal heart sounds.  Good peripheral circulation. Respiratory: Normal respiratory effort without tachypnea  nor retractions. Breath sounds are clear and equal bilaterally. No wheezes, rales, rhonchi. Musculoskeletal:Bilateral pedal pulses equal and easily palpated. Except: left fifth proximal toe mild edema and tenderness and ecchymosis, normal distal sensation and capillary refill, left foot otherwise nontender.  Neurologic:  Normal speech and language. No gross focal neurologic deficits are appreciated. Speech is normal. No gait instability.  Skin:  Skin is warm, dry and intact. No rash noted. Psychiatric: Mood and affect are normal. Speech and behavior are normal. Patient exhibits appropriate insight and judgment   ___________________________________________   LABS (all labs ordered are listed, but only abnormal  results are displayed)  Labs Reviewed - No data to display ____________________________________________  RADIOLOGY  Dg Toe 5th Left  Result Date: 12/30/2016 CLINICAL DATA:  Direct trauma on a puncture leg injuring the fifth toe this morning. EXAM: DG TOE 5TH LEFT COMPARISON:  None in PACs FINDINGS: The bones are subjectively adequately mineralized. 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 The PIP joint is normal in appearance. No acute displaced fracture is observed. There is a tiny bony density adjacent to the ventral aspect of the PIP joint space that could reflect an avulsion fracture fragment but no definite donor site is observed. The fifth MTP joint is unremarkable. There is mild soft tissue swelling. IMPRESSION: Possible avulsion from the ventral aspect of the base of the middle phalanx of the fifth toe. No bony abnormality is observed elsewhere. Electronically Signed   By: David  SwazilandJordan M.D.   On: 12/30/2016 10:58   ____________________________________________   PROCEDURES Procedures   INITIAL IMPRESSION / ASSESSMENT AND PLAN / ED COURSE  Pertinent labs & imaging results that were available during my care of the patient were reviewed by me and considered in my medical decision making (see chart for details).  Well-appearing patient.  No acute distress.  Left fifth toe pain post injury.  X-ray results as above per radiologist possible avulsion from the ventral aspect of the base of the middle phalanx of fifth toe.  Discussed this in detail with patient.  Concern for acute fracture.  Buddy tape applied by RN.  Encouraged ice, rest and support.  Discussed follow up with Primary care physician this week. Discussed follow up and return parameters including no resolution or any worsening concerns. Patient verbalized understanding and agreed to plan.   ____________________________________________   FINAL CLINICAL IMPRESSION(S) / ED DIAGNOSES  Final diagnoses:  Closed  fracture of phalanx of left fifth toe, initial encounter     ED Discharge Orders    None       Note: This dictation was prepared with Dragon dictation along with smaller phrase technology. Any transcriptional errors that result from this process are unintentional.         Renford DillsMiller, [employer], NP 12/30/16 1140

## 2016-12-30 NOTE — ED Triage Notes (Signed)
Pt reports she was crossing her left leg over her right and grazed it on the table leg this morning and believes she has dislocated it. She has dislocated same toe in the past.

## 2017-02-25 ENCOUNTER — Ambulatory Visit: Payer: BLUE CROSS/BLUE SHIELD | Admitting: Family Medicine

## 2017-02-26 ENCOUNTER — Ambulatory Visit (INDEPENDENT_AMBULATORY_CARE_PROVIDER_SITE_OTHER): Payer: BLUE CROSS/BLUE SHIELD | Admitting: Family Medicine

## 2017-02-26 ENCOUNTER — Encounter: Payer: Self-pay | Admitting: Family Medicine

## 2017-02-26 VITALS — BP 118/80 | HR 72 | Ht 68.0 in | Wt 147.0 lb

## 2017-02-26 DIAGNOSIS — J40 Bronchitis, not specified as acute or chronic: Secondary | ICD-10-CM | POA: Diagnosis not present

## 2017-02-26 DIAGNOSIS — J0100 Acute maxillary sinusitis, unspecified: Secondary | ICD-10-CM | POA: Diagnosis not present

## 2017-02-26 MED ORDER — AZITHROMYCIN 250 MG PO TABS: ORAL_TABLET | ORAL | 1 refills | Status: DC

## 2017-02-26 MED ORDER — GUAIFENESIN-CODEINE 100-10 MG/5ML PO SYRP: 5.0000 mL | ORAL_SOLUTION | Freq: Three times a day (TID) | ORAL | 0 refills | Status: DC | PRN

## 2017-02-26 NOTE — Progress Notes (Signed)
Name: Mercedes Ballard   MRN: [id number]    DOB: 11-10-1980   Date:02/26/2017       Progress Note  Subjective  Chief Complaint  Chief Complaint  Patient presents with  . Sinusitis    cough and cong- green production x 11 days    Sinusitis  This is a new problem. The current episode started 1 to 4 weeks ago. The problem has been gradually improving since onset. There has been no fever. The pain is mild. Associated symptoms include congestion, coughing, diaphoresis, ear pain, headaches, a hoarse voice, sinus pressure, sneezing and a sore throat. Pertinent negatives include no chills, neck pain, shortness of breath or swollen glands. Past treatments include oral decongestants and acetaminophen. The treatment provided mild relief.  Cough  This is a new problem. The current episode started 1 to 4 weeks ago (10 days). The problem has been waxing and waning. The cough is productive of purulent sputum (green). Associated symptoms include ear pain, headaches, hemoptysis, myalgias, nasal congestion, postnasal drip and a sore throat. Pertinent negatives include no chest pain, chills, fever, heartburn, rash, shortness of breath, weight loss or wheezing. There is no history of environmental allergies.    No problem-specific Assessment & Plan notes found for this encounter.   Past Medical History:  Diagnosis Date  . Allergy     Past Surgical History:  Procedure Laterality Date  . EYE SURGERY Bilateral 11/2008    Family History  Problem Relation Age of Onset  . Varicose Veins Mother   . Alcohol abuse Father   . Asthma Father   . Depression Father   . Drug abuse Father   . Hypertension Father   . Heart disease Maternal Uncle   . Stroke Maternal Uncle   . Breast cancer Maternal Grandmother 60  . Breast cancer Paternal Grandmother        late 3730's    Social History   Socioeconomic History  . Marital status: Single    Spouse name: Not on file  . Number of children: Not on file  . Years of  education: Not on file  . Highest education level: Not on file  Social Needs  . Financial resource strain: Not on file  . Food insecurity - worry: Not on file  . Food insecurity - inability: Not on file  . Transportation needs - medical: Not on file  . Transportation needs - non-medical: Not on file  Occupational History  . Not on file  Tobacco Use  . Smoking status: Never Smoker  . Smokeless tobacco: Never Used  Substance and Sexual Activity  . Alcohol use: Yes    Alcohol/week: 0.0 oz    Comment: occasional  . Drug use: No  . Sexual activity: Yes    Partners: Male    Birth control/protection: IUD  Other Topics Concern  . Not on file  Social History Narrative  . Not on file    Allergies  Allergen Reactions  . Latex Rash    Outpatient Medications Prior to Visit  Medication Sig Dispense Refill  . Levonorgestrel (MIRENA, 52 MG, IU) by Intrauterine route.    . loratadine (CLARITIN) 10 MG tablet Take 10 mg by mouth daily.     No facility-administered medications prior to visit.     Review of Systems  Constitutional: Positive for diaphoresis. Negative for chills, fever, malaise/fatigue and weight loss.  HENT: Positive for congestion, ear pain, hoarse voice, postnasal drip, sinus pressure, sneezing and sore throat. Negative for ear  discharge.   Eyes: Negative for blurred vision.  Respiratory: Positive for cough and hemoptysis. Negative for sputum production, shortness of breath and wheezing.   Cardiovascular: Negative for chest pain, palpitations and leg swelling.  Gastrointestinal: Negative for abdominal pain, blood in stool, constipation, diarrhea, heartburn, melena and nausea.  Genitourinary: Negative for dysuria, frequency, hematuria and urgency.  Musculoskeletal: Positive for myalgias. Negative for back pain, joint pain and neck pain.  Skin: Negative for rash.  Neurological: Positive for headaches. Negative for dizziness, tingling, sensory change and focal weakness.   Endo/Heme/Allergies: Negative for environmental allergies and polydipsia. Does not bruise/bleed easily.  Psychiatric/Behavioral: Negative for depression and suicidal ideas. The patient is not nervous/anxious and does not have insomnia.      Objective  Vitals:   02/26/17 1524  BP: 118/80  Pulse: 72  Weight: 147 lb (66.7 kg)  Height: 5\' 8"  (1.727 m)    Physical Exam  Constitutional: She is well-developed, well-nourished, and in no distress. No distress.  HENT:  Head: Normocephalic and atraumatic.  Right Ear: External ear normal.  Left Ear: External ear normal.  Nose: Nose normal.  Mouth/Throat: Oropharynx is clear and moist.  Eyes: Conjunctivae and EOM are normal. Pupils are equal, round, and reactive to light. Right eye exhibits no discharge. Left eye exhibits no discharge.  Neck: Normal range of motion. Neck supple. No JVD present. No thyromegaly present.  Cardiovascular: Normal rate, regular rhythm, normal heart sounds and intact distal pulses. Exam reveals no gallop and no friction rub.  No murmur heard. Pulmonary/Chest: Effort normal and breath sounds normal.  Abdominal: Soft. Bowel sounds are normal. She exhibits no mass. There is no tenderness. There is no guarding.  Musculoskeletal: Normal range of motion. She exhibits no edema.  Lymphadenopathy:    She has no cervical adenopathy.  Neurological: She is alert. She has normal reflexes.  Skin: Skin is warm and dry. She is not diaphoretic.  Psychiatric: Mood and affect normal.  Nursing note and vitals reviewed.     Assessment & Plan  Problem List Items Addressed This Visit    None    Visit Diagnoses    Acute maxillary sinusitis, recurrence not specified    -  Primary   Relevant Medications   azithromycin (ZITHROMAX) 250 MG tablet   guaiFENesin-codeine (ROBITUSSIN AC) 100-10 MG/5ML syrup   Bronchitis       Relevant Medications   azithromycin (ZITHROMAX) 250 MG tablet   guaiFENesin-codeine (ROBITUSSIN AC) 100-10  MG/5ML syrup      Meds ordered this encounter  Medications  . azithromycin (ZITHROMAX) 250 MG tablet    Sig: 2 today then 1 a day for 4 days    Dispense:  6 tablet    Refill:  1  . guaiFENesin-codeine (ROBITUSSIN AC) 100-10 MG/5ML syrup    Sig: Take 5 mLs by mouth 3 (three) times daily as needed for cough.    Dispense:  150 mL    Refill:  0      Dr. Hayden [facility]  02/26/17

## 2018-01-24 DIAGNOSIS — Z1283 Encounter for screening for malignant neoplasm of skin: Secondary | ICD-10-CM | POA: Diagnosis not present

## 2018-01-24 DIAGNOSIS — L811 Chloasma: Secondary | ICD-10-CM | POA: Diagnosis not present

## 2018-01-24 DIAGNOSIS — D225 Melanocytic nevi of trunk: Secondary | ICD-10-CM | POA: Diagnosis not present

## 2018-01-24 DIAGNOSIS — D485 Neoplasm of uncertain behavior of skin: Secondary | ICD-10-CM | POA: Diagnosis not present

## 2018-01-25 IMAGING — MG MM DIGITAL SCREENING BILAT W/ TOMO W/ CAD
9 of 12 series · 9 of 28 positions shown · non-contrast
Comparison: None.

CLINICAL DATA: Screening. Baseline exam.

EXAM:
2D DIGITAL SCREENING BILATERAL MAMMOGRAM WITH CAD AND ADJUNCT TOMO

[L CC]
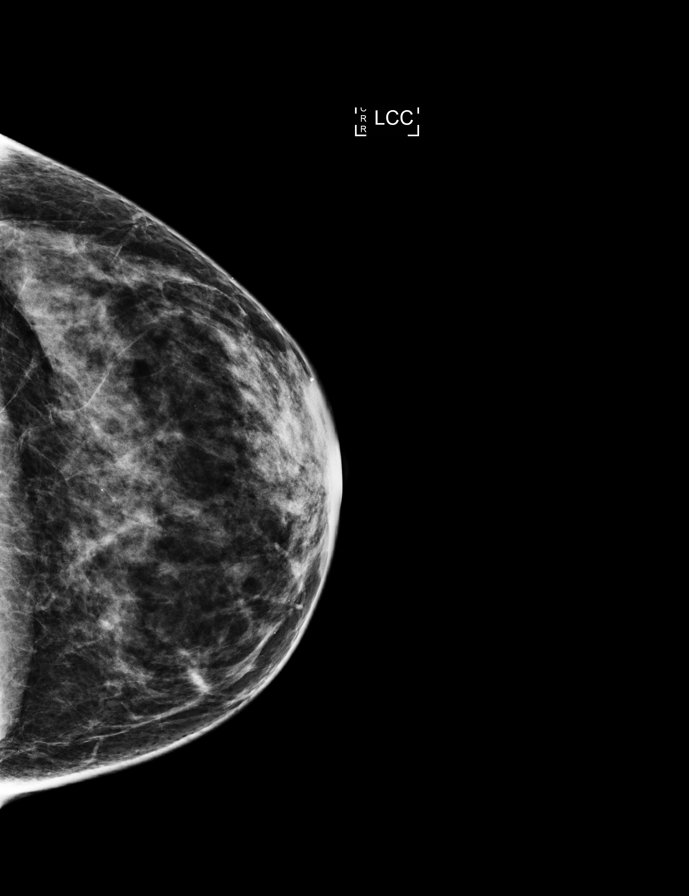

[R MLO synth-2D]
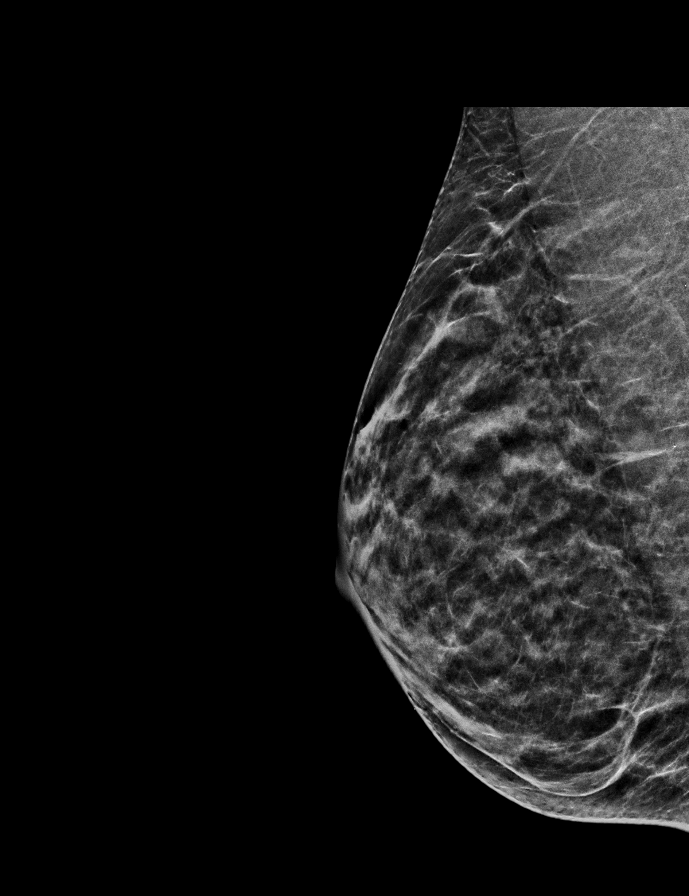

[R CC]
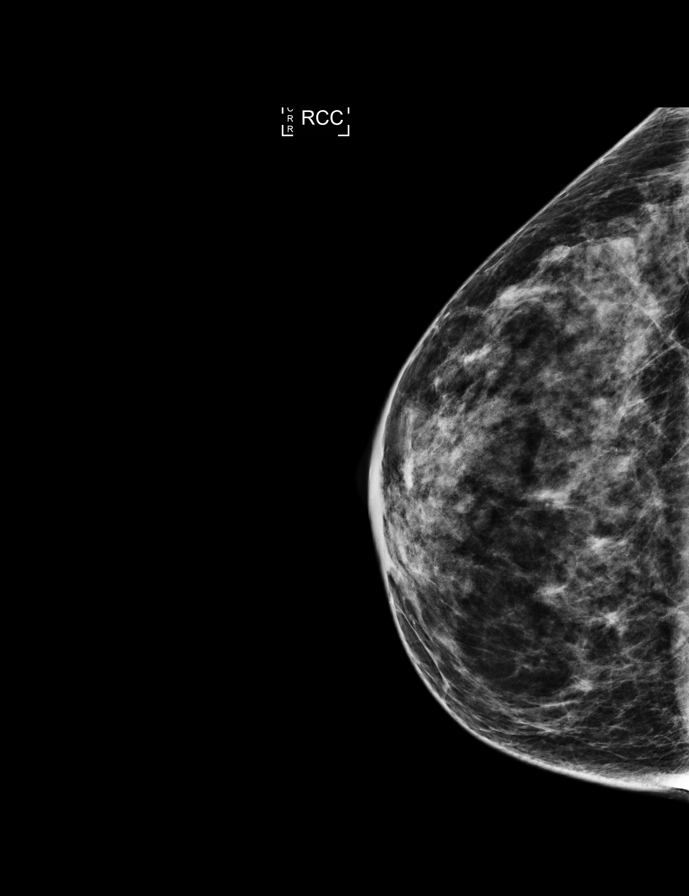

[R CC synth-2D]
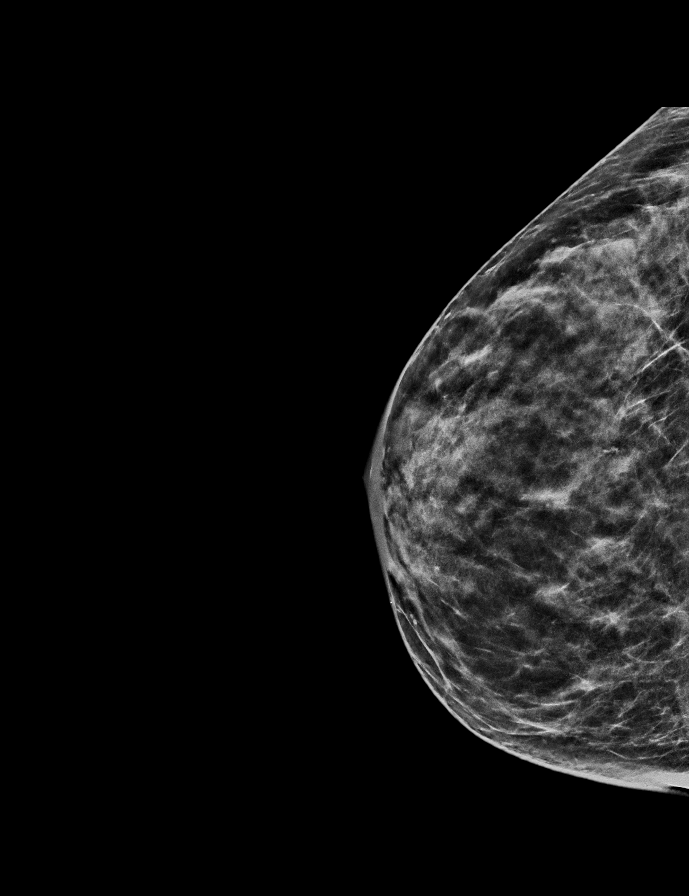

[L MLO]
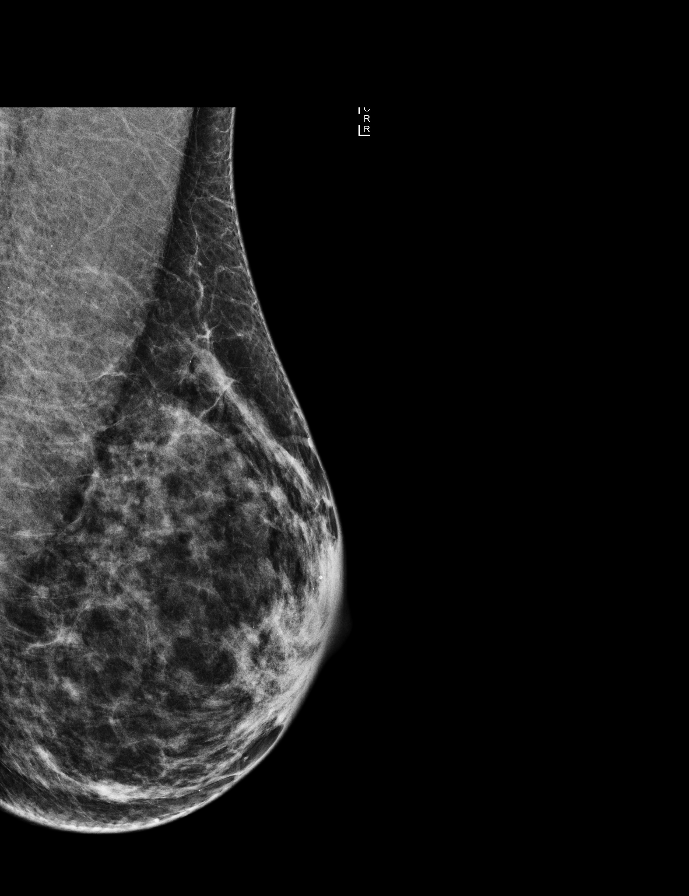

[L MLO synth-2D]
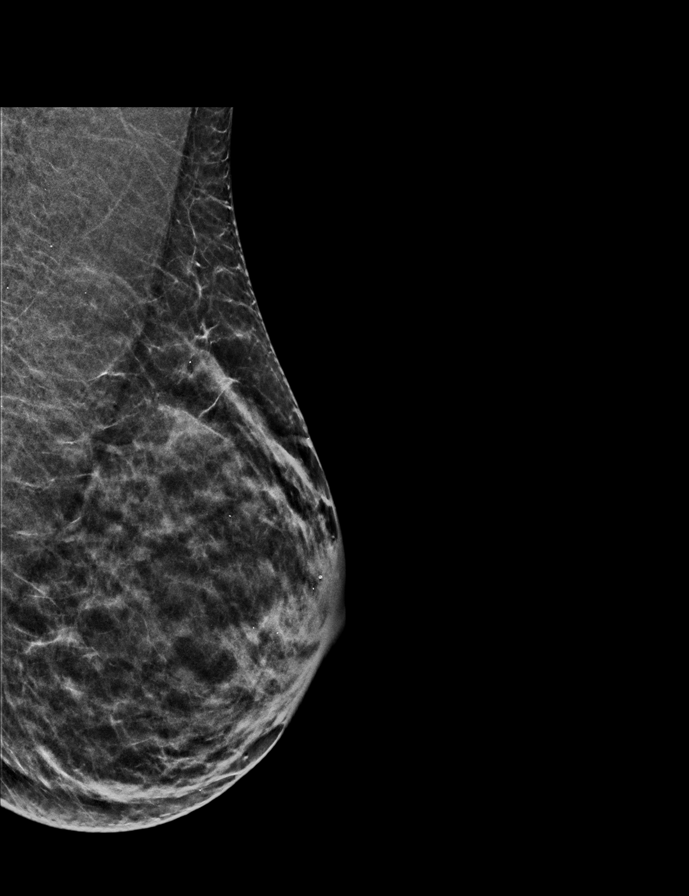

[L CC synth-2D]
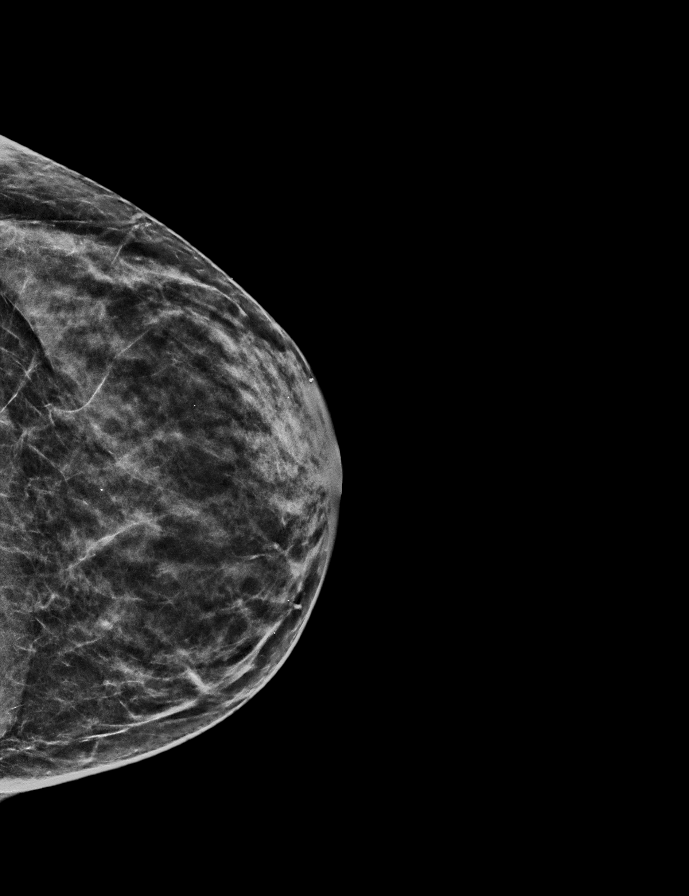

[R MLO]
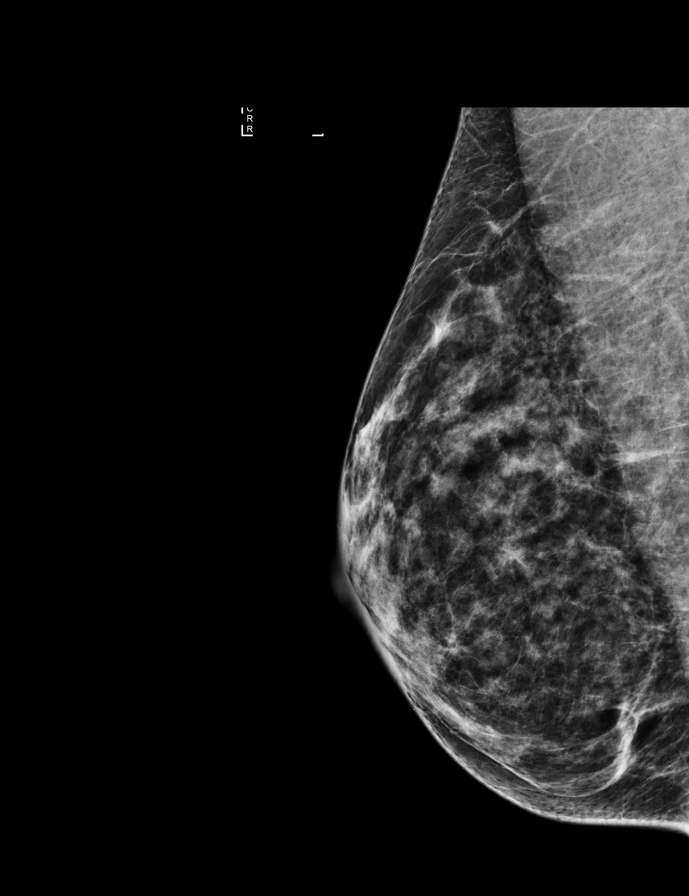

[R CC tomo · tomo slice 25/49.0]
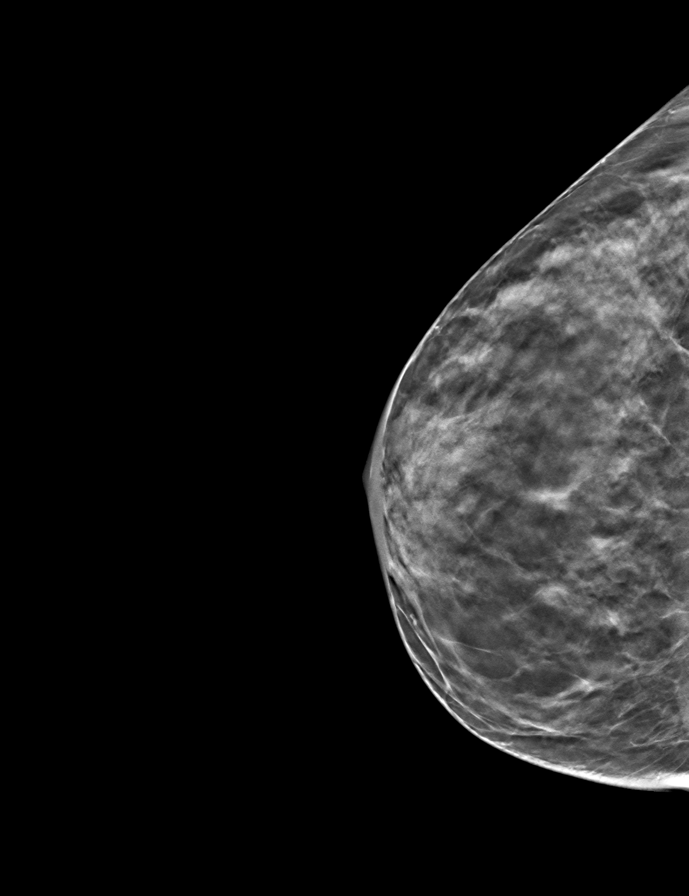

[9 of 28 positions shown; findings below may reference images not displayed]

ACR Breast Density Category c: The breast tissue is heterogeneously
dense, which may obscure small masses
FINDINGS: There are no findings suspicious for malignancy. Images were
processed with CAD.
IMPRESSION: No mammographic evidence of malignancy. A result letter of this
screening mammogram will be mailed directly to the patient.

RECOMMENDATION:
Screening mammogram at age 38. (Code:Y6-B-17L)

BI-RADS CATEGORY  1: Negative.

## 2018-03-16 DIAGNOSIS — Z1329 Encounter for screening for other suspected endocrine disorder: Secondary | ICD-10-CM | POA: Diagnosis not present

## 2018-03-16 DIAGNOSIS — Z130 Encounter for screening for diseases of the blood and blood-forming organs and certain disorders involving the immune mechanism: Secondary | ICD-10-CM | POA: Diagnosis not present

## 2018-03-16 DIAGNOSIS — Z131 Encounter for screening for diabetes mellitus: Secondary | ICD-10-CM | POA: Diagnosis not present

## 2018-03-16 DIAGNOSIS — Z0000 Encounter for general adult medical examination without abnormal findings: Secondary | ICD-10-CM | POA: Diagnosis not present

## 2018-03-16 DIAGNOSIS — Z13228 Encounter for screening for other metabolic disorders: Secondary | ICD-10-CM | POA: Diagnosis not present

## 2018-03-16 DIAGNOSIS — Z1321 Encounter for screening for nutritional disorder: Secondary | ICD-10-CM | POA: Diagnosis not present

## 2018-03-16 DIAGNOSIS — Z13220 Encounter for screening for lipoid disorders: Secondary | ICD-10-CM | POA: Diagnosis not present

## 2018-03-16 DIAGNOSIS — Z1239 Encounter for other screening for malignant neoplasm of breast: Secondary | ICD-10-CM | POA: Diagnosis not present

## 2018-03-16 DIAGNOSIS — Z23 Encounter for immunization: Secondary | ICD-10-CM | POA: Diagnosis not present

## 2018-03-31 DIAGNOSIS — R198 Other specified symptoms and signs involving the digestive system and abdomen: Secondary | ICD-10-CM | POA: Diagnosis not present

## 2018-04-29 DIAGNOSIS — Z23 Encounter for immunization: Secondary | ICD-10-CM | POA: Diagnosis not present

## 2018-05-12 DIAGNOSIS — R0600 Dyspnea, unspecified: Secondary | ICD-10-CM | POA: Diagnosis not present

## 2018-07-18 DIAGNOSIS — D229 Melanocytic nevi, unspecified: Secondary | ICD-10-CM | POA: Diagnosis not present

## 2018-07-18 DIAGNOSIS — Z86018 Personal history of other benign neoplasm: Secondary | ICD-10-CM | POA: Diagnosis not present

## 2018-07-18 DIAGNOSIS — L578 Other skin changes due to chronic exposure to nonionizing radiation: Secondary | ICD-10-CM | POA: Diagnosis not present

## 2018-07-18 DIAGNOSIS — D239 Other benign neoplasm of skin, unspecified: Secondary | ICD-10-CM | POA: Diagnosis not present

## 2018-09-13 IMAGING — CR DG TOE 5TH 2+V*L*
3 series · 3 of 3 positions shown · non-contrast
Comparison: None in PACs

CLINICAL DATA: Direct trauma on a puncture leg injuring the fifth
toe this morning.

EXAM:
DG TOE 5TH LEFT

[toe ap]
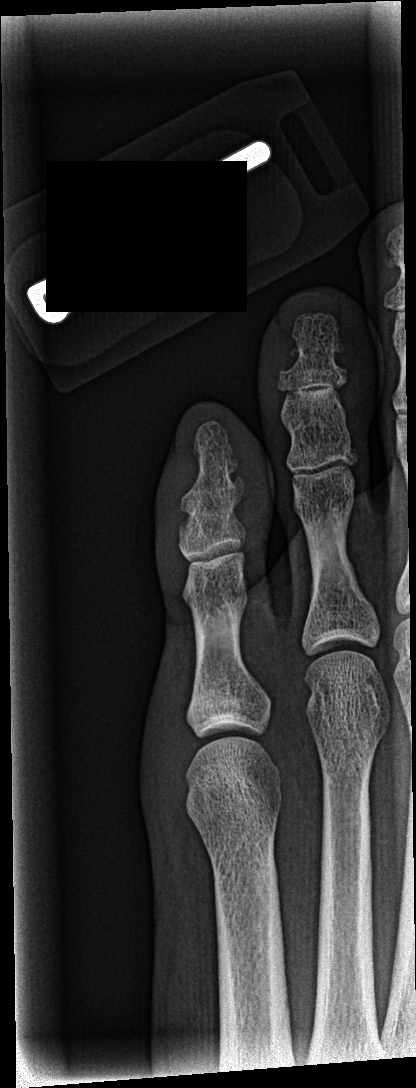

[toe obl]
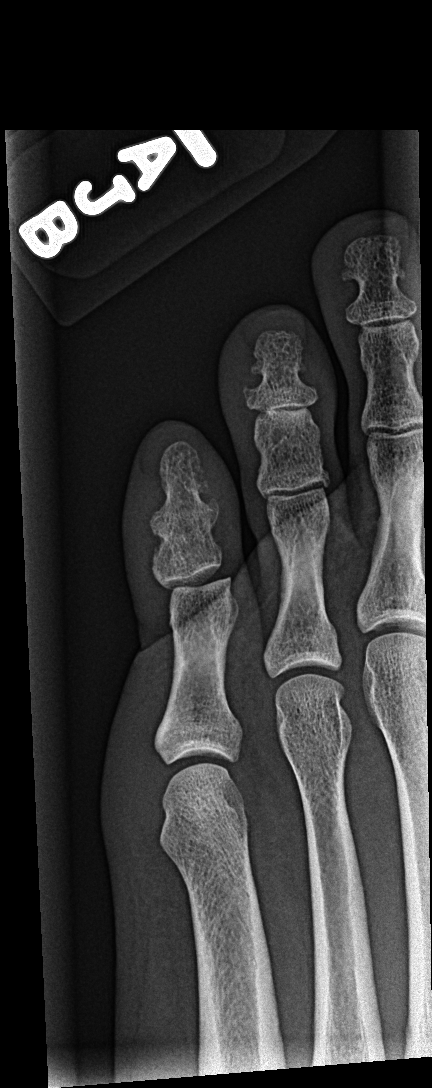

[toe lat]
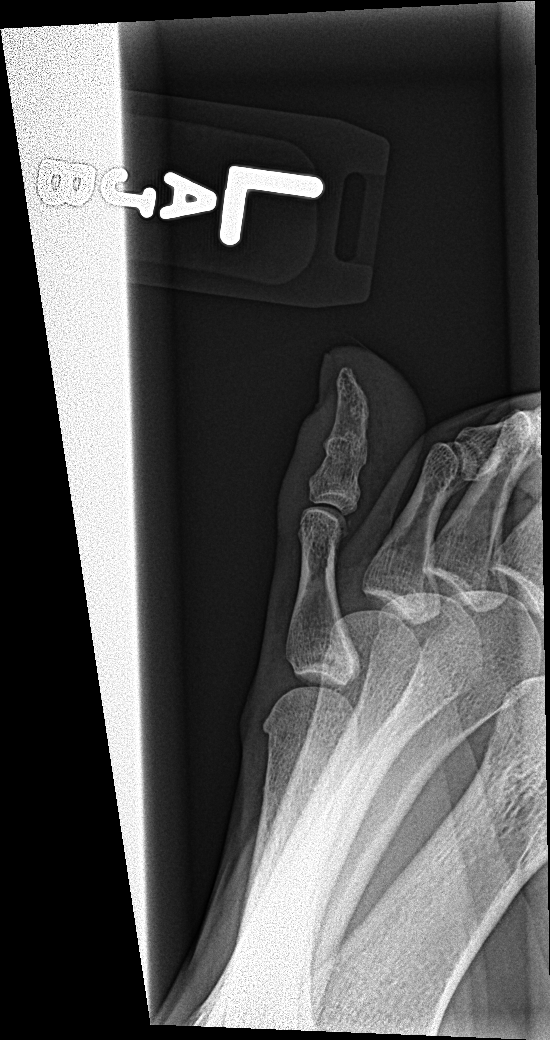

[3 of 3 positions shown; findings below may reference images not displayed]

FINDINGS: The bones are subjectively adequately mineralized. There is
congenital fusion across the DIP joint. The PIP joint is normal in
appearance. No acute displaced fracture is observed. There is a tiny
bony density adjacent to the ventral aspect of the PIP joint space
that could reflect an avulsion fracture fragment but no definite
donor site is observed. The fifth MTP joint is unremarkable. There
is mild soft tissue swelling.
IMPRESSION: Possible avulsion from the ventral aspect of the base of the middle
phalanx of the fifth toe. No bony abnormality is observed elsewhere.

## 2019-01-04 DIAGNOSIS — Z20828 Contact with and (suspected) exposure to other viral communicable diseases: Secondary | ICD-10-CM | POA: Diagnosis not present

## 2023-05-17 ENCOUNTER — Telehealth: Admitting: Physician Assistant

## 2023-05-17 DIAGNOSIS — K649 Unspecified hemorrhoids: Secondary | ICD-10-CM

## 2023-05-17 NOTE — Addendum Note (Signed)
 Addended by: Margaretann Loveless on: 05/17/2023 04:25 PM   Modules accepted: Level of Service

## 2023-05-17 NOTE — Progress Notes (Signed)
 Because of desiring banding for a hemorrhoid, I feel your condition warrants further evaluation and I recommend that you be seen for a face to face visit.  Please contact your primary care physician practice to be seen. Many offices offer virtual options to be seen via video if you are not comfortable going in person to a medical facility at this time. It would be recommended to follow up with your Primary Care Provider to be able to be referred to a General Surgeon for this procedure.  NOTE: You will NOT be charged for this eVisit.  If you do not have a PCP, [facility] offers a free physician referral service available at [phone]. Our trained staff has the experience, knowledge and resources to put you in touch with a physician who is right for you.    If you are having a true medical emergency please call 911.   Your e-visit answers were reviewed by a board certified advanced clinical practitioner to complete your personal care plan.  Thank you for using e-Visits.   I have spent 5 minutes in review of e-visit questionnaire, review and updating patient chart, medical decision making and response to patient.   Margaretann Loveless, PA-C

## 2023-06-07 ENCOUNTER — Ambulatory Visit: Admitting: Student

## 2023-06-07 ENCOUNTER — Other Ambulatory Visit (HOSPITAL_COMMUNITY)
Admission: RE | Admit: 2023-06-07 | Discharge: 2023-06-07 | Disposition: A | Discharge status: Home / Self Care | Source: Ambulatory Visit | Attending: Student | Admitting: Student

## 2023-06-07 VITALS — BP 110/64 | HR 65 | Ht 68.0 in | Wt 139.1 lb

## 2023-06-07 DIAGNOSIS — K641 Second degree hemorrhoids: Secondary | ICD-10-CM

## 2023-06-07 DIAGNOSIS — Z1159 Encounter for screening for other viral diseases: Secondary | ICD-10-CM | POA: Diagnosis not present

## 2023-06-07 DIAGNOSIS — F909 Attention-deficit hyperactivity disorder, unspecified type: Secondary | ICD-10-CM | POA: Insufficient documentation

## 2023-06-07 DIAGNOSIS — Z113 Encounter for screening for infections with a predominantly sexual mode of transmission: Secondary | ICD-10-CM | POA: Diagnosis not present

## 2023-06-07 DIAGNOSIS — Z124 Encounter for screening for malignant neoplasm of cervix: Secondary | ICD-10-CM

## 2023-06-07 DIAGNOSIS — Z0000 Encounter for general adult medical examination without abnormal findings: Secondary | ICD-10-CM | POA: Insufficient documentation

## 2023-06-07 DIAGNOSIS — K649 Unspecified hemorrhoids: Secondary | ICD-10-CM | POA: Insufficient documentation

## 2023-06-07 DIAGNOSIS — N951 Menopausal and female climacteric states: Secondary | ICD-10-CM | POA: Insufficient documentation

## 2023-06-07 NOTE — Progress Notes (Signed)
 New Patient Office Visit  Subjective    Patient ID: Mercedes Ballard, female    DOB: 26-Apr-1980  Age: 43 y.o. MRN: [id number]  CC:  Chief Complaint  Patient presents with   Establish Care   Hemorrhoids    HPI Mercedes Ballard presents to establish care  Hemorrhoids  This has been an issue since giving birth to her son in 2006. Notices it intermittently prolapsing which bothers her. Generally this happens when straining or sitting on toilet for too long. Would like to have this removed if possible. Denies associated pain, itching, constipation, or bleeding. Denies family history of colon cancer.   Outpatient Encounter Medications as of 06/07/2023  Medication Sig   amphetamine-dextroamphetamine (ADDERALL) 10 MG tablet Take 10 mg by mouth daily with breakfast.   estradiol (VIVELLE-DOT) 0.05 MG/24HR patch Place 1 patch onto the skin 2 (two) times a week.   Levonorgestrel (MIRENA, 52 MG, IU) by Intrauterine route.   loratadine (CLARITIN) 10 MG tablet Take 10 mg by mouth daily.   progesterone (PROMETRIUM) 100 MG capsule Take 100 mg by mouth daily.   tretinoin (RETIN-A) 0.025 % cream Apply topically at bedtime.   No facility-administered encounter medications on file as of 06/07/2023.    Past Medical History:  Diagnosis Date   Allergy     Past Surgical History:  Procedure Laterality Date   EYE SURGERY Bilateral 11/2008    Family History  Problem Relation Age of Onset   Osteoarthritis Mother    Varicose Veins Mother    Alcohol abuse Father    Asthma Father    Depression Father    Drug abuse Father    Hypertension Father    Breast cancer Maternal Grandmother 1   Breast cancer Paternal Grandmother        late 30's   Heart disease Maternal Uncle    Stroke Maternal Uncle     Social History   Socioeconomic History   Marital status: Single    Spouse name: Not on file   Number of children: Not on file   Years of education: Not on file   Highest education level: Bachelor's  degree (e.g., BA, AB, BS)  Occupational History   Not on file  Tobacco Use   Smoking status: Never   Smokeless tobacco: Never  Vaping Use   Vaping status: Never Used  Substance and Sexual Activity   Alcohol use: Yes    Alcohol/week: 0.0 standard drinks of alcohol    Comment: occasional   Drug use: No   Sexual activity: Yes    Partners: Male    Birth control/protection: I.U.D.  Other Topics Concern   Not on file  Social History Narrative   Not on file   Social Drivers of Health   Financial Resource Strain: Low Risk  (06/07/2023)   Overall Financial Resource Strain (CARDIA)    Difficulty of Paying Living Expenses: Not hard at all  Food Insecurity: No Food Insecurity (06/07/2023)   Hunger Vital Sign    Worried About Running Out of Food in the Last Year: Never true    Ran Out of Food in the Last Year: Never true  Transportation Needs: No Transportation Needs (06/07/2023)   PRAPARE - Administrator, Civil Service (Medical): No    Lack of Transportation (Non-Medical): No  Physical Activity: Sufficiently Active (06/07/2023)   Exercise Vital Sign    Days of Exercise per Week: 5 days    Minutes of Exercise per Session: 30  min  Stress: No Stress Concern Present (06/07/2023)   [employer] of Occupational Health - Occupational Stress Questionnaire    Feeling of Stress : Only a little  Social Connections: Unknown (06/07/2023)   Social Connection and Isolation Panel [NHANES]    Frequency of Communication with Friends and Family: Twice a week    Frequency of Social Gatherings with Friends and Family: Twice a week    Attends Religious Services: Patient declined    Database administrator or Organizations: No    Attends Engineer, structural: Not on file    Marital Status: Separated  Intimate Partner Violence: Not At Risk (06/07/2023)   Humiliation, Afraid, Rape, and Kick questionnaire    Fear of Current or Ex-Partner: No    Emotionally Abused: No    Physically  Abused: No    Sexually Abused: No    ROS Refer to HPI    Objective   BP 110/64   Pulse 65   Ht 5\' 8"  (1.727 m)   Wt 139 lb 2 oz (63.1 kg)   SpO2 99%   BMI 21.15 kg/m   Physical Exam Constitutional:      Appearance: Normal appearance.  HENT:     Mouth/Throat:     Mouth: Mucous membranes are moist.     Pharynx: Oropharynx is clear.  Cardiovascular:     Rate and Rhythm: Normal rate and regular rhythm.  Pulmonary:     Effort: Pulmonary effort is normal.     Breath sounds: No rhonchi or rales.  Abdominal:     General: Abdomen is flat. Bowel sounds are normal. There is no distension.     Palpations: Abdomen is soft.     Tenderness: There is no abdominal tenderness.  Genitourinary:    General: Normal vulva.     Vagina: Normal. No tenderness, bleeding or lesions.     Rectum: Guaiac result negative. Internal hemorrhoid present. No tenderness or anal fissure.  Musculoskeletal:        General: Normal range of motion.     Right lower leg: No edema.     Left lower leg: No edema.  Skin:    General: Skin is warm and dry.     Capillary Refill: Capillary refill takes less than 2 seconds.  Neurological:     General: No focal deficit present.     Mental Status: She is alert and oriented to person, place, and time.  Psychiatric:        Mood and Affect: Mood normal.        Behavior: Behavior normal.        06/07/2023   10:21 AM 08/01/2014   11:06 AM  Depression screen PHQ 2/9  Decreased Interest 0 0  Down, Depressed, Hopeless 0 0  PHQ - 2 Score 0 0  Altered sleeping 1   Tired, decreased energy 0   Change in appetite 0   Feeling bad or failure about yourself  0   Trouble concentrating 1   Moving slowly or fidgety/restless 0   Suicidal thoughts 0   PHQ-9 Score 2       06/07/2023   10:21 AM  GAD 7 : Generalized Anxiety Score  Nervous, Anxious, on Edge 1  Control/stop worrying 1  Worry too much - different things 1  Trouble relaxing 1  Restless 0  Easily annoyed or  irritable 1  Afraid - awful might happen 0  Total GAD 7 Score 5  Anxiety Difficulty Somewhat difficult  Last CBC Lab Results  Component Value Date   WBC 14.2 (H) 07/29/2014   HGB 12.7 07/29/2014   HCT 38.0 07/29/2014   MCV 88.4 07/29/2014   MCH 29.5 07/29/2014   RDW 12.5 07/29/2014   PLT 170 07/29/2014   Last metabolic panel Lab Results  Component Value Date   GLUCOSE 86 04/30/2016   NA 144 04/30/2016   K 4.1 04/30/2016   CL 103 04/30/2016   CO2 25 04/30/2016   BUN 13 04/30/2016   CREATININE 0.93 04/30/2016   GFRNONAA 79 04/30/2016   CALCIUM 9.2 04/30/2016   PHOS 3.2 04/30/2016   ALBUMIN 4.7 04/30/2016   ANIONGAP 7 07/29/2014   Last lipids Lab Results  Component Value Date   CHOL 180 04/30/2016   HDL 71 04/30/2016   LDLCALC 92 04/30/2016   TRIG 87 04/30/2016   CHOLHDL 2.5 04/30/2016     Assessment & Plan:  Annual physical exam Assessment & Plan: Discussed healthy lifestyle and regular exercise.  Breast cancer screening: BIRADS 3 on mammogram in September 2024. Dense breast tissue with benign US  on 3/24/204, due for mammogram in September which she will schedule Pap: Reports normal PAP more than 10 years ago, no symptoms today, pap smear performed STI: she recently ended a long term monogamous relationship, currently abstinent would like STI screening today Labwork ordered today  Orders: -     CBC with Differential/Platelet -     Comprehensive metabolic panel with GFR -     Hemoglobin A1c -     TSH -     Lipid panel -     VITAMIN D  25 Hydroxy (Vit-D Deficiency, Fractures)  Screening for viral disease -     HIV Antibody (routine testing w rflx) -     Hepatitis C antibody  Encounter for Papanicolaou smear for cervical cancer screening -     Cytology - PAP  Routine screening for STI (sexually transmitted infection) -     RPR  Grade II hemorrhoids Assessment & Plan: Grade II hemorrhoids on exam. Does not have many symptoms aside from discomfort  with prolapsing. Reports she has plenty of fiber in her diet. She would like surgical evaluation. Referral to surgery.   Orders: -     Ambulatory referral to General Surgery  Attention deficit hyperactivity disorder (ADHD), unspecified ADHD type Assessment & Plan: Sees psych through [facility], currently on adderall 10 mg daily   Perimenopause Assessment & Plan: On estrogen and progesterone, sees GYN for this     Return in about 1 year (around 06/06/2024) for physical.   Barnetta Liberty, MD

## 2023-06-07 NOTE — Assessment & Plan Note (Signed)
 On estrogen and progesterone, sees GYN for this

## 2023-06-07 NOTE — Assessment & Plan Note (Signed)
 Sees psych through [facility], currently on adderall 10 mg daily

## 2023-06-07 NOTE — Assessment & Plan Note (Signed)
 Grade II hemorrhoids on exam. Does not have many symptoms aside from discomfort with prolapsing. Reports she has plenty of fiber in her diet. She would like surgical evaluation. Referral to surgery.

## 2023-06-07 NOTE — Assessment & Plan Note (Addendum)
 Discussed healthy lifestyle and regular exercise.  Breast cancer screening: BIRADS 3 on mammogram in September 2024. Dense breast tissue with benign US  on 3/24/204, due for mammogram in September which she will schedule Pap: Reports normal PAP more than 10 years ago, no symptoms today, pap smear performed STI: she recently ended a long term monogamous relationship, currently abstinent would like STI screening today Labwork ordered today

## 2023-06-08 ENCOUNTER — Encounter: Payer: Self-pay | Admitting: Student

## 2023-06-08 LAB — CBC WITH DIFFERENTIAL/PLATELET
Basophils Absolute: 0 10*3/uL (ref 0.0–0.2)
Basos: 1 %
EOS (ABSOLUTE): 0 10*3/uL (ref 0.0–0.4)
Eos: 1 %
Hematocrit: 37 % (ref 34.0–46.6)
Hemoglobin: 11.9 g/dL (ref 11.1–15.9)
Immature Grans (Abs): 0 10*3/uL (ref 0.0–0.1)
Immature Granulocytes: 0 %
Lymphocytes Absolute: 1.8 10*3/uL (ref 0.7–3.1)
Lymphs: 28 %
MCH: 30.4 pg (ref 26.6–33.0)
MCHC: 32.2 g/dL (ref 31.5–35.7)
MCV: 94 fL (ref 79–97)
Monocytes Absolute: 0.5 10*3/uL (ref 0.1–0.9)
Monocytes: 8 %
Neutrophils Absolute: 3.9 10*3/uL (ref 1.4–7.0)
Neutrophils: 62 %
Platelets: 242 10*3/uL (ref 150–450)
RBC: 3.92 x10E6/uL (ref 3.77–5.28)
RDW: 11.8 % (ref 11.7–15.4)
WBC: 6.3 10*3/uL (ref 3.4–10.8)

## 2023-06-08 LAB — HEMOGLOBIN A1C
Est. average glucose Bld gHb Est-mCnc: 111 mg/dL
Hgb A1c MFr Bld: 5.5 % (ref 4.8–5.6)

## 2023-06-08 LAB — COMPREHENSIVE METABOLIC PANEL WITH GFR
ALT: 12 IU/L (ref 0–32)
AST: 18 IU/L (ref 0–40)
Albumin: 4.4 g/dL (ref 3.9–4.9)
Alkaline Phosphatase: 56 IU/L (ref 44–121)
BUN/Creatinine Ratio: 13 (ref 9–23)
BUN: 12 mg/dL (ref 6–24)
Bilirubin Total: 0.5 mg/dL (ref 0.0–1.2)
CO2: 24 mmol/L (ref 20–29)
Calcium: 9.1 mg/dL (ref 8.7–10.2)
Chloride: 105 mmol/L (ref 96–106)
Creatinine, Ser: 0.95 mg/dL (ref 0.57–1.00)
Globulin, Total: 2.1 g/dL (ref 1.5–4.5)
Glucose: 88 mg/dL (ref 70–99)
Potassium: 4 mmol/L (ref 3.5–5.2)
Sodium: 139 mmol/L (ref 134–144)
Total Protein: 6.5 g/dL (ref 6.0–8.5)
eGFR: 76 mL/min/{1.73_m2} (ref >59)

## 2023-06-08 LAB — RPR: RPR Ser Ql: NONREACTIVE

## 2023-06-08 LAB — LIPID PANEL
Chol/HDL Ratio: 2.3 ratio (ref 0.0–4.4)
Cholesterol, Total: 156 mg/dL (ref 100–199)
HDL: 67 mg/dL (ref >39)
LDL Chol Calc (NIH): 76 mg/dL (ref 0–99)
Triglycerides: 66 mg/dL (ref 0–149)
VLDL Cholesterol Cal: 13 mg/dL (ref 5–40)

## 2023-06-08 LAB — HEPATITIS C ANTIBODY: Hep C Virus Ab: NONREACTIVE

## 2023-06-08 LAB — HIV ANTIBODY (ROUTINE TESTING W REFLEX): HIV Screen 4th Generation wRfx: NONREACTIVE

## 2023-06-08 LAB — TSH: TSH: 1.98 u[IU]/mL (ref 0.450–4.500)

## 2023-06-08 LAB — VITAMIN D 25 HYDROXY (VIT D DEFICIENCY, FRACTURES): Vit D, 25-Hydroxy: 27.5 ng/mL — ABNORMAL LOW (ref 30.0–100.0)

## 2023-06-09 NOTE — Telephone Encounter (Signed)
 Please review and advise patient.   JM

## 2023-06-11 ENCOUNTER — Telehealth: Payer: Self-pay

## 2023-06-11 ENCOUNTER — Other Ambulatory Visit: Payer: Self-pay | Admitting: Student

## 2023-06-11 DIAGNOSIS — K641 Second degree hemorrhoids: Secondary | ICD-10-CM

## 2023-06-11 NOTE — Telephone Encounter (Signed)
 Please review.   KP  Copied from CRM [id number]. Topic: Referral - Question >> Jun 11, 2023 12:13 PM Santiya F wrote: Reason for CRM: Patient is calling in because she had a referral for [facility] and she reached out to them and was told they don't do banding services. Patient was told that [facility] does banding services and she wanted to know if she could ger a referral there instead. Patient is requesting a call back once the referral has been sent to GI.

## 2023-06-14 ENCOUNTER — Ambulatory Visit: Payer: Self-pay | Admitting: Surgery

## 2023-06-14 LAB — CYTOLOGY - PAP
Adequacy: ABSENT
Chlamydia: NEGATIVE
Comment: NEGATIVE
Comment: NEGATIVE
Comment: NEGATIVE
Comment: NEGATIVE
Comment: NORMAL
Diagnosis: NEGATIVE
HSV1: NEGATIVE
HSV2: NEGATIVE
High risk HPV: NEGATIVE
Neisseria Gonorrhea: NEGATIVE
Trichomonas: NEGATIVE

## 2023-11-13 ENCOUNTER — Ambulatory Visit
Admission: EM | Admit: 2023-11-13 | Discharge: 2023-11-13 | Disposition: A | Discharge status: Home / Self Care | Attending: Emergency Medicine | Admitting: Emergency Medicine

## 2023-11-13 ENCOUNTER — Encounter: Payer: Self-pay | Admitting: Emergency Medicine

## 2023-11-13 DIAGNOSIS — S39012A Strain of muscle, fascia and tendon of lower back, initial encounter: Secondary | ICD-10-CM

## 2023-11-13 MED ORDER — BACLOFEN 10 MG PO TABS: 10.0000 mg | ORAL_TABLET | Freq: Three times a day (TID) | ORAL | 0 refills | Status: AC

## 2023-11-13 MED ORDER — IBUPROFEN 600 MG PO TABS: 600.0000 mg | ORAL_TABLET | Freq: Four times a day (QID) | ORAL | 0 refills | Status: AC | PRN

## 2023-11-13 NOTE — ED Triage Notes (Signed)
 Patient states that she was doing dead lifts at the gym Patient she was doing 35lb in each hand instead of 30lbs.  Patient states that after doing the lifts she started having lower back pain. Patient states that yesterday morning she started have nausea but no vomiting.  Patient has been taking Tylenol .  Patient states that she tried a friends cyclobenzaprine and did help with her muscle tightness and pain.

## 2023-11-13 NOTE — Discharge Instructions (Addendum)

## 2023-11-13 NOTE — ED Provider Notes (Signed)
 [facility]    CSN: [id number] Arrival date & time: 11/13/23  9056      History   Chief Complaint Chief Complaint  Patient presents with   Back Pain   Back Injury    HPI Mercedes Ballard is a 43 y.o. female.   HPI  43 year old female with past medical history significant for allergic rhinitis, and ADHD presents for evaluation of low back pain.  She reports that she was doing dead lift at the gym yesterday with 35 pound dumbbells in each hand rather than her typical 30.  On her third set she noticed that she started having tension and pain in her low back.  Yesterday morning when she woke up she had pain in her back that caused significant nausea and diaphoresis.  She only had 1 episode and that resolved.  She has been using family members cyclobenzaprine along with ibuprofen and has had improvement of symptoms.  Past Medical History:  Diagnosis Date   Allergy     Patient Active Problem List   Diagnosis Date Noted   Annual physical exam 06/07/2023   ADHD 06/07/2023   Hemorrhoids 06/07/2023   Perimenopause 06/07/2023   Allergic rhinitis, seasonal 08/01/2014    Past Surgical History:  Procedure Laterality Date   EYE SURGERY Bilateral 11/2008    OB History   No obstetric history on file.      Home Medications    Prior to Admission medications   Medication Sig Start Date End Date Taking? Authorizing Provider  baclofen (LIORESAL) 10 MG tablet Take 1 tablet (10 mg total) by mouth 3 (three) times daily. 11/13/23  Yes Bernardino Ditch, NP  ibuprofen (ADVIL) 600 MG tablet Take 1 tablet (600 mg total) by mouth every 6 (six) hours as needed. 11/13/23  Yes Bernardino Ditch, NP  amphetamine-dextroamphetamine (ADDERALL) 10 MG tablet Take 10 mg by mouth daily with breakfast.    [provider]  estradiol (VIVELLE-DOT) 0.05 MG/24HR patch Place 1 patch onto the skin 2 (two) times a week.    [provider]  Levonorgestrel (MIRENA, 52 MG, IU) by Intrauterine  route.    [provider]  loratadine (CLARITIN) 10 MG tablet Take 10 mg by mouth daily.    [provider]  tretinoin (RETIN-A) 0.025 % cream Apply topically at bedtime. 05/07/23   [provider]    Family History Family History  Problem Relation Age of Onset   Osteoarthritis Mother    Varicose Veins Mother    Alcohol abuse Father    Asthma Father    Depression Father    Drug abuse Father    Hypertension Father    Breast cancer Maternal Grandmother 54   Breast cancer Paternal Grandmother        late 30's   Heart disease Maternal Uncle    Stroke Maternal Uncle     Social History Social History   Tobacco Use   Smoking status: Never   Smokeless tobacco: Never  Vaping Use   Vaping status: Never Used  Substance Use Topics   Alcohol use: Yes    Alcohol/week: 0.0 standard drinks of alcohol    Comment: occasional   Drug use: No     Allergies   Latex   Review of Systems Review of Systems  Constitutional:  Negative for fever.  Musculoskeletal:  Positive for back pain.  Neurological:  Negative for weakness and numbness.     Physical Exam Triage Vital Signs ED Triage Vitals  Encounter  Vitals Group     BP      Girls Systolic BP Percentile      Girls Diastolic BP Percentile      Boys Systolic BP Percentile      Boys Diastolic BP Percentile      Pulse      Resp      Temp      Temp src      SpO2      Weight      Height      Head Circumference      Peak Flow      Pain Score      Pain Loc      Pain Education      Exclude from Growth Chart    No data found.  Updated Vital Signs BP 90/64 (BP Location: Left Arm)   Pulse 71   Temp 97.7 F (36.5 C) (Oral)   Resp 14   Ht 5' 8 (1.727 m)   Wt 139 lb 1.8 oz (63.1 kg)   SpO2 100%   BMI 21.15 kg/m   Visual Acuity Right Eye Distance:   Left Eye Distance:   Bilateral Distance:    Right Eye Near:   Left Eye Near:    Bilateral Near:     Physical Exam Vitals and nursing note  reviewed.  Constitutional:      Appearance: Normal appearance. She is not ill-appearing.  HENT:     Head: Normocephalic and atraumatic.  Musculoskeletal:        General: Tenderness present. No signs of injury.  Skin:    General: Skin is warm and dry.     Capillary Refill: Capillary refill takes less than 2 seconds.  Neurological:     General: No focal deficit present.     Mental Status: She is alert and oriented to person, place, and time.     Sensory: No sensory deficit.     Motor: No weakness.     Deep Tendon Reflexes: Reflexes normal.      UC Treatments / Results  Labs (all labs ordered are listed, but only abnormal results are displayed) Labs Reviewed - No data to display  EKG   Radiology No results found.  Procedures Procedures (including critical care time)  Medications Ordered in UC Medications - No data to display  Initial Impression / Assessment and Plan / UC Course  I have reviewed the triage vital signs and the nursing notes.  Pertinent labs & imaging results that were available during my care of the patient were reviewed by me and considered in my medical decision making (see chart for details).     Patient is a pleasant, nontoxic-appearing 43 year old female presenting for evaluation of low back pain as outlined in HPI above.  In the exam room she is sitting on the edge of the exam table with her back straight.  Movement, especially forward flexion and extension do increase the pain.  Right lateral rotation slightly increases pain in the right lumbar region but left lateral rotation does not cause any difficulties.  She has 5/5 bilateral lower extremity strength, negative straight leg raise bilaterally, and 2+ DTRs bilaterally.  She has no midline spinous process tenderness or step-off in her lower thoracic or lumbar spine.  She does have tension primarily in the right lumbar paraspinous region.  I will treat her for a lumbar strain with 600 mg ibuprofen every  6 hours coupled with 10 mg of baclofen every 8 hours.  We discussed using moist heat as well as home physical therapy.  I have advised her that if her symptoms do not improve she is to follow-up with orthopedics as she may need dedicated physical therapy or other diagnostic imaging or procedures.  Work note provided. Final Clinical Impressions(s) / UC Diagnoses   Final diagnoses:  Strain of lumbar region, initial encounter     Discharge Instructions      Take the ibuprofen, 600 mg every 6 hours with food, on a schedule for the next 48 hours and then as needed.  Take the baclofen, 10 mg every 8 hours, on a schedule for the next 48 hours and then as needed.  Apply moist heat to your back for 30 minutes at a time 2-3 times a day to improve blood flow to the area and help remove the lactic acid causing the spasm.  Follow the back exercises given at discharge.  Return for reevaluation for any new or worsening symptoms.      ED Prescriptions     Medication Sig Dispense Auth. Provider   ibuprofen (ADVIL) 600 MG tablet Take 1 tablet (600 mg total) by mouth every 6 (six) hours as needed. 30 tablet Bernardino Ditch, NP   baclofen (LIORESAL) 10 MG tablet Take 1 tablet (10 mg total) by mouth 3 (three) times daily. 30 each Bernardino Ditch, NP      PDMP not reviewed this encounter.   Bernardino Ditch, NP 11/13/23 1042
